# Patient Record
Sex: Male | Born: 1950 | Race: Black or African American | Hispanic: No | Marital: Single | Smoking: Never smoker
Health system: Southern US, Community
[De-identification: ages and names within clinical notes are randomized; demographics above are authoritative.]

## PROBLEM LIST (undated history)

## (undated) DIAGNOSIS — N400 Enlarged prostate without lower urinary tract symptoms: Secondary | ICD-10-CM

## (undated) DIAGNOSIS — I10 Essential (primary) hypertension: Secondary | ICD-10-CM

## (undated) DIAGNOSIS — R42 Dizziness and giddiness: Secondary | ICD-10-CM

## (undated) HISTORY — PX: REPAIR KNEE LIGAMENT: SUR1188

## (undated) HISTORY — DX: Benign prostatic hyperplasia without lower urinary tract symptoms: N40.0

## (undated) HISTORY — DX: Dizziness and giddiness: R42

## (undated) HISTORY — DX: Essential (primary) hypertension: I10

---

## 1993-04-29 HISTORY — PX: NEPHRECTOMY: SHX65

## 2012-08-09 ENCOUNTER — Ambulatory Visit (INDEPENDENT_AMBULATORY_CARE_PROVIDER_SITE_OTHER): Admitting: Emergency Medicine

## 2012-08-09 VITALS — BP 128/86 | HR 76 | Temp 97.6°F | Resp 16 | Ht 69.0 in | Wt 158.0 lb

## 2012-08-09 DIAGNOSIS — H8110 Benign paroxysmal vertigo, unspecified ear: Secondary | ICD-10-CM

## 2012-08-09 DIAGNOSIS — R42 Dizziness and giddiness: Secondary | ICD-10-CM

## 2012-08-09 DIAGNOSIS — R112 Nausea with vomiting, unspecified: Secondary | ICD-10-CM

## 2012-08-09 DIAGNOSIS — D649 Anemia, unspecified: Secondary | ICD-10-CM

## 2012-08-09 LAB — POCT CBC
Granulocyte percent: 77 %G (ref 37–80)
HCT, POC: 40.5 % — AB (ref 43.5–53.7)
Lymph, poc: 1.2 (ref 0.6–3.4)
MCHC: 30.4 g/dL — AB (ref 31.8–35.4)
MCV: 84 fL (ref 80–97)
MID (cbc): 0.3 (ref 0–0.9)
POC LYMPH PERCENT: 17.9 %L (ref 10–50)
Platelet Count, POC: 220 10*3/uL (ref 142–424)
RDW, POC: 14 %

## 2012-08-09 MED ORDER — ONDANSETRON 4 MG PO TBDP: ORAL_TABLET | ORAL | Status: DC

## 2012-08-09 MED ORDER — ONDANSETRON 4 MG PO TBDP
8.0000 mg | ORAL_TABLET | Freq: Once | ORAL | Status: AC
  Administered 2012-08-09: 8 mg via ORAL

## 2012-08-09 MED ORDER — DIAZEPAM 5 MG PO TABS: ORAL_TABLET | ORAL | Status: DC

## 2012-08-09 NOTE — Progress Notes (Signed)
  Subjective:    Patient ID: Darrell Wells, male    DOB: August 22, 1950, 62 y.o.   MRN: [id number]  HPI Pt woke up this morning and was getting dressed for church when he started to fell like the room was spinning. Thought it was his BP. He tried to eat a little, twice, and he vomited both times. Feels better sitting up vs lying down. Pt sensitive to positional movements.  Not currently feeling dizzy or nauseous, but did vomit while waiting in our lobby. Had this a few years ago and was diagnosed with vertigo and htn. He was put on BP med at that time. Pt very healthy otherwise. Exercises. Just retired from [employer].  Has some tinnitus, but has had hearing checked and it was normal.   Review of Systems     Objective:   Physical Exam pupils equal round regular react to light direct and consensual. Cranial nerves II through XII are intact the no diplopia noted on confrontation. No nystagmus was noted. Patient did state he felt nauseated with dizziness when he looked in different directions. His chest was clear heart regular rate without murmurs the abdomen is soft there are no areas of tenderness. Deep tendon reflexes of the knees and ankles and brachial radialis are 2+  Results for orders placed in visit on 08/09/12  POCT CBC      Result Value Range   WBC 6.6  4.6 - 10.2 K/uL   Lymph, poc 1.2  0.6 - 3.4   POC LYMPH PERCENT 17.9  10 - 50 %L   MID (cbc) 0.3  0 - 0.9   POC MID % 5.1  0 - 12 %M   POC Granulocyte 5.1  2 - 6.9   Granulocyte percent 77.0  37 - 80 %G   RBC 4.82  4.69 - 6.13 M/uL   Hemoglobin 12.3 (*) 14.1 - 18.1 g/dL   HCT, POC 95.6 (*) 21.3 - 53.7 %   MCV 84.0  80 - 97 fL   MCH, POC 25.5 (*) 27 - 31.2 pg   MCHC 30.4 (*) 31.8 - 35.4 g/dL   RDW, POC 08.6     Platelet Count, POC 220  142 - 424 K/uL   MPV 9.6  0 - 99.8 fL  GLUCOSE, POCT (MANUAL RESULT ENTRY)      Result Value Range   POC Glucose 121 (*) 70 - 99 mg/dl        Assessment & Plan:  We'll go ahead and check  glucose and CBC. He was given Zofran 8 mg ODT . We'll treat with Valium low dose. He's given informational materials on vertigo.

## 2012-08-09 NOTE — Patient Instructions (Addendum)
Vertigo Vertigo means you feel like you or your surroundings are moving when they are not. Vertigo can be dangerous if it occurs when you are at work, driving, or performing difficult activities.  CAUSES  Vertigo occurs when there is a conflict of signals sent to your brain from the visual and sensory systems in your body. There are many different causes of vertigo, including:  Infections, especially in the inner ear.  A bad reaction to a drug or misuse of alcohol and medicines.  Withdrawal from drugs or alcohol.  Rapidly changing positions, such as lying down or rolling over in bed.  A migraine headache.  Decreased blood flow to the brain.  Increased pressure in the brain from a head injury, infection, tumor, or bleeding. SYMPTOMS  You may feel as though the world is spinning around or you are falling to the ground. Because your balance is upset, vertigo can cause nausea and vomiting. You may have involuntary eye movements (nystagmus). DIAGNOSIS  Vertigo is usually diagnosed by physical exam. If the cause of your vertigo is unknown, your caregiver may perform imaging tests, such as an MRI scan (magnetic resonance imaging). TREATMENT  Most cases of vertigo resolve on their own, without treatment. Depending on the cause, your caregiver may prescribe certain medicines. If your vertigo is related to body position issues, your caregiver may recommend movements or procedures to correct the problem. In rare cases, if your vertigo is caused by certain inner ear problems, you may need surgery. HOME CARE INSTRUCTIONS   Follow your caregiver's instructions.  Avoid driving.  Avoid operating heavy machinery.  Avoid performing any tasks that would be dangerous to you or others during a vertigo episode.  Tell your caregiver if you notice that certain medicines seem to be causing your vertigo. Some of the medicines used to treat vertigo episodes can actually make them worse in some people. SEEK  IMMEDIATE MEDICAL CARE IF:   Your medicines do not relieve your vertigo or are making it worse.  You develop problems with talking, walking, weakness, or using your arms, hands, or legs.  You develop severe headaches.  Your nausea or vomiting continues or gets worse.  You develop visual changes.  A family member notices behavioral changes.  Your condition gets worse. MAKE SURE YOU:  Understand these instructions.  Will watch your condition.  Will get help right away if you are not doing well or get worse. Document Released: 01/23/2005 Document Revised: 07/08/2011 Document Reviewed: 11/01/2010 ExitCare Patient Information 2013 [employer].  

## 2012-08-10 LAB — IRON AND TIBC: %SAT: 26 % (ref 20–55)

## 2012-08-11 NOTE — Progress Notes (Signed)
Left msg for pt to schedule future CPE with Dr. Daub. 

## 2012-08-12 NOTE — Progress Notes (Signed)
Pt scheduled CPE with Dr. Cleta Alberts for 12/29/12.

## 2012-12-29 ENCOUNTER — Ambulatory Visit (INDEPENDENT_AMBULATORY_CARE_PROVIDER_SITE_OTHER): Admitting: Emergency Medicine

## 2012-12-29 ENCOUNTER — Encounter: Payer: Self-pay | Admitting: Emergency Medicine

## 2012-12-29 ENCOUNTER — Telehealth: Payer: Self-pay | Admitting: *Deleted

## 2012-12-29 VITALS — BP 138/94 | HR 69 | Temp 98.0°F | Resp 16 | Ht 69.0 in | Wt 156.0 lb

## 2012-12-29 DIAGNOSIS — N529 Male erectile dysfunction, unspecified: Secondary | ICD-10-CM

## 2012-12-29 DIAGNOSIS — L989 Disorder of the skin and subcutaneous tissue, unspecified: Secondary | ICD-10-CM

## 2012-12-29 DIAGNOSIS — I10 Essential (primary) hypertension: Secondary | ICD-10-CM

## 2012-12-29 DIAGNOSIS — N400 Enlarged prostate without lower urinary tract symptoms: Secondary | ICD-10-CM

## 2012-12-29 DIAGNOSIS — Z0000 Encounter for general adult medical examination without abnormal findings: Secondary | ICD-10-CM

## 2012-12-29 DIAGNOSIS — Z1211 Encounter for screening for malignant neoplasm of colon: Secondary | ICD-10-CM

## 2012-12-29 LAB — POCT URINALYSIS DIPSTICK
Bilirubin, UA: NEGATIVE
Glucose, UA: NEGATIVE
Leukocytes, UA: NEGATIVE
Nitrite, UA: NEGATIVE
Urobilinogen, UA: 0.2

## 2012-12-29 LAB — POCT UA - MICROSCOPIC ONLY
Bacteria, U Microscopic: NEGATIVE
Casts, Ur, LPF, POC: NEGATIVE
Crystals, Ur, HPF, POC: NEGATIVE
Mucus, UA: NEGATIVE
Yeast, UA: NEGATIVE

## 2012-12-29 LAB — COMPREHENSIVE METABOLIC PANEL
ALT: 8 U/L (ref 0–53)
AST: 15 U/L (ref 0–37)
Albumin: 3.9 g/dL (ref 3.5–5.2)
Alkaline Phosphatase: 67 U/L (ref 39–117)
BUN: 15 mg/dL (ref 6–23)
Chloride: 105 mEq/L (ref 96–112)
Potassium: 3.8 mEq/L (ref 3.5–5.3)
Sodium: 138 mEq/L (ref 135–145)

## 2012-12-29 LAB — PSA: PSA: 0.48 ng/mL (ref <=4.00)

## 2012-12-29 LAB — LIPID PANEL
HDL: 60 mg/dL (ref >39)
LDL Cholesterol: 85 mg/dL (ref 0–99)
Total CHOL/HDL Ratio: 2.7 Ratio
Triglycerides: 80 mg/dL (ref <150)

## 2012-12-29 LAB — IFOBT (OCCULT BLOOD): IFOBT: NEGATIVE

## 2012-12-29 MED ORDER — FINASTERIDE 5 MG PO TABS: 5.0000 mg | ORAL_TABLET | Freq: Every day | ORAL | Status: DC

## 2012-12-29 MED ORDER — LISINOPRIL 20 MG PO TABS: 20.0000 mg | ORAL_TABLET | Freq: Every day | ORAL | Status: DC

## 2012-12-29 MED ORDER — SILDENAFIL CITRATE 100 MG PO TABS: 50.0000 mg | ORAL_TABLET | Freq: Every day | ORAL | Status: DC | PRN

## 2012-12-29 MED ORDER — VARDENAFIL HCL 20 MG PO TABS: ORAL_TABLET | ORAL | Status: DC

## 2012-12-29 NOTE — Telephone Encounter (Signed)
The patient's pharmacy called about the Levitra, the insurance needs an authorization for medication. Dr Cleta Alberts was advised and he asked  me to call patient to see if he wanted to try Cialis or Viagra. i called and the patient will try Viagra. Also, I called [employer] ([employer] sciences)) and the  the insurance will pay for medicine.

## 2012-12-29 NOTE — Addendum Note (Signed)
Addended by: Lesle Chris A on: 12/29/2012 03:15 PM   Modules accepted: Orders, Medications

## 2012-12-29 NOTE — Progress Notes (Signed)
@[email or web address] @  Patient ID: Darrell Wells MRN: [id number], DOB: [birth year] 62 y.o. Date of Encounter: 12/29/2012, 8:51 AM  Primary Physician: No PCP Per Patient  Chief Complaint: Physical (CPE)  HPI: 62 y.o. y/o male with history noted below here for CPE.  Doing well. No issues/complaints.  Review of Systems:  Consitutional: No fever, chills, fatigue, night sweats, lymphadenopathy, or weight changes. Eyes: No visual changes, eye redness, or discharge. ENT/Mouth: Ears: No otalgia, tinnitus, hearing loss, discharge. Nose: No congestion, rhinorrhea, sinus pain, or epistaxis. Throat: No sore throat, post nasal drip, or teeth pain. Cardiovascular: No CP, palpitations, diaphoresis, DOE, edema, orthopnea, PND. Respiratory: No cough, hemoptysis, SOB, or wheezing. Gastrointestinal: No anorexia, dysphagia, reflux, pain, nausea, vomiting, hematemesis, diarrhea, constipation, BRBPR, or melena. Genitourinary: No dysuria, frequency, urgency, hematuria, incontinence, nocturia, decreased urinary stream, discharge, impotence, or testicular pain/masses. Musculoskeletal: He recently tore his left quadriceps tendon had surgical repair through the [employer] services .  Skin: No rash, erythema, lesion changes, pain, warmth, jaundice, or pruritis. Neurological: No headache, dizziness, syncope, seizures, tremors, memory loss, coordination problems, or paresthesias. Psychological: No anxiety, depression, hallucinations, SI/HI. Endocrine: No fatigue, polydipsia, polyphagia, polyuria, or known diabetes. All other systems were reviewed and are otherwise negative.  No past medical history on file.   No past surgical history on file.  Home Meds:  Prior to Admission medications   Medication Sig Start Date End Date Taking? Authorizing Provider  finasteride (PROSCAR) 5 MG tablet Take 5 mg by mouth daily.   Yes Historical Provider, MD  lisinopril (PRINIVIL,ZESTRIL) 20 MG tablet Take 20 mg by mouth daily.   Yes Historical  Provider, MD  diazepam (VALIUM) 5 MG tablet Take one half to one tablet every 6-8 hours as needed for dizziness 08/09/12   Collene Gobble, MD  ondansetron (ZOFRAN ODT) 4 MG disintegrating tablet Take 1-2 tablets to dissolve in your mouth every 6-8 hours as needed for 08/09/12   Collene Gobble, MD    Allergies: No Known Allergies  History   Social History  . Marital Status: Single    Spouse Name: N/A    Number of Children: N/A  . Years of Education: N/A   Occupational History  . Not on file.   Social History Main Topics  . Smoking status: Never Smoker   . Smokeless tobacco: Not on file  . Alcohol Use: Yes  . Drug Use: No  . Sexual Activity: Not on file   Other Topics Concern  . Not on file   Social History Narrative  . No narrative on file    No family history on file.  Physical Exam:  Blood pressure 138/94, pulse 69, temperature 98 F (36.7 C), temperature source Oral, resp. rate 16, height 5\' 9"  (1.753 m), weight 156 lb (70.761 kg), SpO2 100.00%.  General: Well developed, well nourished, in no acute distress. HEENT: Normocephalic, atraumatic. Conjunctiva pink, sclera non-icteric. Pupils 2 mm constricting to 1 mm, round, regular, and equally reactive to light and accomodation. EOMI. Internal auditory canal clear. TMs with good cone of light and without pathology. Nasal mucosa pink. Nares are without discharge. No sinus tenderness. Oral mucosa pink. Dentition . Pharynx without exudate.   Neck: Supple. Trachea midline. No thyromegaly. Full ROM. No lymphadenopathy. Lungs: Clear to auscultation bilaterally without wheezes, rales, or rhonchi. Breathing is of normal effort and unlabored. Cardiovascular: RRR with S1 S2. No murmurs, rubs, or gallops appreciated. Distal pulses 2+ symmetrically. No carotid or abdominal bruits.  Abdomen: Soft, non-tender, non-distended with normoactive  bowel sounds. No hepatosplenomegaly or masses. No rebound/guarding. No CVA tenderness. Without  hernias.  Rectal: No external hemorrhoids or fissures. Rectal vault without masses.   Genitourinary:   circumcised male. No penile lesions. Testes descended bilaterally, and smooth without tenderness or masses.  Musculoskeletal: Full range of motion and 5/5 strength throughout. Without swelling, atrophy, tenderness, crepitus, or warmth. Extremities without clubbing, cyanosis, or edema. Calves supple. Patient has a long leg brace over the left knee. Skin: Warm and moist without erythema, ecchymosis, wounds, or rash. It is a half by half centimeter irregular pigmented lesion over the buttocks Neuro: A+Ox3. CN II-XII grossly intact. Moves all extremities spontaneously. Full sensation throughout. Normal gait. DTR 2+ throughout upper and lower extremities. Finger to nose intact. Psych:  Responds to questions appropriately with a normal affect.   Results for orders placed in visit on 12/29/12  IFOBT (OCCULT BLOOD)      Result Value Range   IFOBT Negative    POCT URINALYSIS DIPSTICK      Result Value Range   Color, UA yellow     Clarity, UA clear     Glucose, UA neg     Bilirubin, UA neg     Ketones, UA neg     Spec Grav, UA >=1.030     Blood, UA trace     pH, UA 6.0     Protein, UA neg     Urobilinogen, UA 0.2     Nitrite, UA neg     Leukocytes, UA Negative    POCT UA - MICROSCOPIC ONLY      Result Value Range   WBC, Ur, HPF, POC neg     RBC, urine, microscopic 0-4     Bacteria, U Microscopic neg     Mucus, UA neg     Epithelial cells, urine per micros 0-1     Crystals, Ur, HPF, POC neg     Casts, Ur, LPF, POC neg     Yeast, UA neg     Studies:      Assessment/Plan:  62 y.o. y/o male here for followup physical exam. He did have a pigmented mole on his buttocks and needs to be seen by the dermatologist and have set him up for screening colonoscopy. Routine labs were done  -  Signed, Earl Lites, MD 12/29/2012 8:51 AM

## 2013-01-05 ENCOUNTER — Encounter: Payer: Self-pay | Admitting: Gastroenterology

## 2013-03-08 ENCOUNTER — Ambulatory Visit (AMBULATORY_SURGERY_CENTER): Payer: Self-pay | Admitting: *Deleted

## 2013-03-08 VITALS — Ht 69.0 in | Wt 163.0 lb

## 2013-03-08 DIAGNOSIS — Z1211 Encounter for screening for malignant neoplasm of colon: Secondary | ICD-10-CM

## 2013-03-08 MED ORDER — NA SULFATE-K SULFATE-MG SULF 17.5-3.13-1.6 GM/177ML PO SOLN: 1.0000 | Freq: Once | ORAL | Status: DC

## 2013-03-08 NOTE — Progress Notes (Signed)
No allergies to eggs or soy. No problems with anesthesia.  

## 2013-03-22 ENCOUNTER — Ambulatory Visit (AMBULATORY_SURGERY_CENTER): Payer: TRICARE For Life (TFL) | Admitting: Gastroenterology

## 2013-03-22 ENCOUNTER — Encounter: Payer: Self-pay | Admitting: Gastroenterology

## 2013-03-22 VITALS — BP 109/78 | HR 71 | Temp 96.3°F | Resp 16 | Ht 69.0 in | Wt 163.0 lb

## 2013-03-22 DIAGNOSIS — Z1211 Encounter for screening for malignant neoplasm of colon: Secondary | ICD-10-CM

## 2013-03-22 DIAGNOSIS — D126 Benign neoplasm of colon, unspecified: Secondary | ICD-10-CM

## 2013-03-22 MED ORDER — SODIUM CHLORIDE 0.9 % IV SOLN: 500.0000 mL | INTRAVENOUS | Status: DC

## 2013-03-22 NOTE — Op Note (Signed)
 [facility] [address] [place], [address]   COLONOSCOPY PROCEDURE REPORT  PATIENT: Darrell Wells, Darrell Wells  MR#: [id number] BIRTHDATE: January 08, 1951 , 62  yrs. old GENDER: Male ENDOSCOPIST: Louis Meckel, MD REFERRED XB:JYNWGN Cleta Alberts, M.D. PROCEDURE DATE:  03/22/2013 PROCEDURE:   Colonoscopy with cold biopsy polypectomy First Screening Colonoscopy - Avg.  risk and is 50 yrs.  old or older Yes.  Prior Negative Screening - Now for repeat screening. N/A  History of Adenoma - Now for follow-up colonoscopy & has been > or = to 3 yrs.  N/A  Polyps Removed Today? Yes. ASA CLASS:   Class II INDICATIONS:average risk screening. MEDICATIONS: MAC sedation, administered by CRNA and propofol (Diprivan) 200mg  IV  DESCRIPTION OF PROCEDURE:   After the risks benefits and alternatives of the procedure were thoroughly explained, informed consent was obtained.  A digital rectal exam revealed no abnormalities of the rectum.   The LB [id number]  endoscope was introduced through the anus and advanced to the cecum, which was identified by both the appendix and ileocecal valve. No adverse events experienced.   The quality of the prep was excellent using Suprep  The instrument was then slowly withdrawn as the colon was fully examined.      COLON FINDINGS: A sessile polyp was found at the cecum.  A polypectomy was performed with cold forceps.   Internal hemorrhoids were found.   The colon was otherwise normal.  There was no diverticulosis, inflammation, polyps or cancers unless previously stated.  Retroflexed views revealed no abnormalities. The time to cecum=3 minutes 16 seconds.  Withdrawal time=7 minutes 15 seconds. The scope was withdrawn and the procedure completed. COMPLICATIONS: There were no complications.  ENDOSCOPIC IMPRESSION: 1.   Sessile polyp was found at the cecum; polypectomy was performed with cold forceps 2.   Internal hemorrhoids 3.   The colon was otherwise  normal  RECOMMENDATIONS: If the polyp(s) removed today are proven to be adenomatous (pre-cancerous) polyps, you will need a repeat colonoscopy in 5 years.  Otherwise you should continue to follow colorectal cancer screening guidelines for "routine risk" patients with colonoscopy in 10 years.  You will receive a letter within 1-2 weeks with the results of your biopsy as well as final recommendations.  Please call my office if you have not received a letter after 3 weeks.   eSigned:  Louis Meckel, MD 03/22/2013 12:02 PM   cc:   PATIENT NAME:  Darrell Wells, Darrell Wells MR#: [id number]

## 2013-03-22 NOTE — Progress Notes (Signed)
Patient did not experience any of the following events: a burn prior to discharge; a fall within the facility; wrong site/side/patient/procedure/implant event; or a hospital transfer or hospital admission upon discharge from the facility. (G8907) Patient did not have preoperative order for IV antibiotic SSI prophylaxis. (G8918)  

## 2013-03-22 NOTE — Patient Instructions (Signed)
YOU HAD AN ENDOSCOPIC PROCEDURE TODAY AT THE [facility]: Refer to the procedure report that was given to you for any specific questions about what was found during the examination.  If the procedure report does not answer your questions, please call your gastroenterologist to clarify.  If you requested that your care partner not be given the details of your procedure findings, then the procedure report has been included in a sealed envelope for you to review at your convenience later.  YOU SHOULD EXPECT: Some feelings of bloating in the abdomen. Passage of more gas than usual.  Walking can help get rid of the air that was put into your GI tract during the procedure and reduce the bloating. If you had a lower endoscopy (such as a colonoscopy or flexible sigmoidoscopy) you may notice spotting of blood in your stool or on the toilet paper. If you underwent a bowel prep for your procedure, then you may not have a normal bowel movement for a few days.  DIET: Your first meal following the procedure should be a light meal and then it is ok to progress to your normal diet.  A half-sandwich or bowl of soup is an example of a good first meal.  Heavy or fried foods are harder to digest and may make you feel nauseous or bloated.  Likewise meals heavy in dairy and vegetables can cause extra gas to form and this can also increase the bloating.  Drink plenty of fluids but you should avoid alcoholic beverages for 24 hours.  ACTIVITY: Your care partner should take you home directly after the procedure.  You should plan to take it easy, moving slowly for the rest of the day.  You can resume normal activity the day after the procedure however you should NOT DRIVE or use heavy machinery for 24 hours (because of the sedation medicines used during the test).    SYMPTOMS TO REPORT IMMEDIATELY: A gastroenterologist can be reached at any hour.  During normal business hours, 8:30 AM to 5:00 PM Monday through Friday,  call [phone].  After hours and on weekends, please call the GI answering service at [phone] who will take a message and have the physician on call contact you.   Following lower endoscopy (colonoscopy or flexible sigmoidoscopy):  Excessive amounts of blood in the stool  Significant tenderness or worsening of abdominal pains  Swelling of the abdomen that is new, acute  Fever of 100F or higher    FOLLOW UP: If any biopsies were taken you will be contacted by phone or by letter within the next 1-3 weeks.  Call your gastroenterologist if you have not heard about the biopsies in 3 weeks.  Our staff will call the home number listed on your records the next business day following your procedure to check on you and address any questions or concerns that you may have at that time regarding the information given to you following your procedure. This is a courtesy call and so if there is no answer at the home number and we have not heard from you through the emergency physician on call, we will assume that you have returned to your regular daily activities without incident.  SIGNATURES/CONFIDENTIALITY: You and/or your care partner have signed paperwork which will be entered into your electronic medical record.  These signatures attest to the fact that that the information above on your After Visit Summary has been reviewed and is understood.  Full responsibility of the confidentiality   of this discharge information lies with you and/or your care-partner.    Information on polyps,hemorrhoids,& hemorrhoid banding given to you today as requested by Dr. Arlyce Dice.

## 2013-03-22 NOTE — Progress Notes (Signed)
Called to room to assist during endoscopic procedure.  Patient ID and intended procedure confirmed with present staff. Received instructions for my participation in the procedure from the performing physician.  

## 2013-03-22 NOTE — Progress Notes (Signed)
  [facility] Anesthesia Post-op Note  Patient: Darrell Wells  Procedure(s) Performed: colonoscopy  Patient Location: [facility] - Recovery Area  Anesthesia Type: Deep Sedation/Propofol  Level of Consciousness: awake, oriented and patient cooperative  Airway and Oxygen Therapy: Patient Spontanous Breathing  Post-op Pain: none  Post-op Assessment:  Post-op Vital signs reviewed, Patient's Cardiovascular Status Stable, Respiratory Function Stable, Patent Airway, No signs of Nausea or vomiting and Pain level controlled  Post-op Vital Signs: Reviewed and stable  Complications: No apparent anesthesia complications  Travares Nelles E 12:04 PM

## 2013-03-23 ENCOUNTER — Telehealth: Payer: Self-pay

## 2013-03-23 NOTE — Telephone Encounter (Signed)
  Follow up Call-  Call back number 03/22/2013  Post procedure Call Back phone  # [phone]  Permission to leave phone message Yes     Patient questions:  Do you have a fever, pain , or abdominal swelling? no Pain Score  0 *  Have you tolerated food without any problems? yes  Have you been able to return to your normal activities? yes  Do you have any questions about your discharge instructions: Diet   no Medications  no Follow up visit  no  Do you have questions or concerns about your Care? no  Actions: * If pain score is 4 or above: No action needed, pain <4.  Per the pt everything went very well.  He said he felt great this am. Maw

## 2013-03-29 ENCOUNTER — Encounter: Payer: Self-pay | Admitting: Gastroenterology

## 2013-12-22 ENCOUNTER — Encounter: Payer: Self-pay | Admitting: Gastroenterology

## 2014-01-11 ENCOUNTER — Encounter: Payer: Self-pay | Admitting: Gastroenterology

## 2014-01-27 ENCOUNTER — Other Ambulatory Visit: Payer: Self-pay | Admitting: Emergency Medicine

## 2014-01-31 NOTE — Telephone Encounter (Signed)
Dr Everlene Farrier, pt is due for CPE. I pended 1 RF w/note needs OV for more for your review.

## 2014-02-25 ENCOUNTER — Telehealth: Payer: Self-pay | Admitting: *Deleted

## 2014-02-25 NOTE — Telephone Encounter (Signed)
T/C to patient to cancel colonoscopy. Last colonoscopy 02/2013, recall 5 years. Error with recall letter being sent for 1 year. Per Dr. Deatra Ina, cancel colonoscopy scheduled for 03/15/14. No answer. Number identifier. Message left that previsit and colonoscopy will be cancelled. Recall will be entered for 02/2018. If patient is having any concerns, then he will need an office visit.

## 2014-03-15 ENCOUNTER — Encounter: Payer: TRICARE For Life (TFL) | Admitting: Gastroenterology

## 2014-05-03 ENCOUNTER — Ambulatory Visit (INDEPENDENT_AMBULATORY_CARE_PROVIDER_SITE_OTHER): Admitting: Emergency Medicine

## 2014-05-03 ENCOUNTER — Encounter: Payer: Self-pay | Admitting: Emergency Medicine

## 2014-05-03 VITALS — BP 148/86 | HR 61 | Temp 97.5°F | Resp 14 | Ht 69.25 in | Wt 161.4 lb

## 2014-05-03 DIAGNOSIS — L989 Disorder of the skin and subcutaneous tissue, unspecified: Secondary | ICD-10-CM

## 2014-05-03 DIAGNOSIS — Z0000 Encounter for general adult medical examination without abnormal findings: Secondary | ICD-10-CM

## 2014-05-03 DIAGNOSIS — N400 Enlarged prostate without lower urinary tract symptoms: Secondary | ICD-10-CM

## 2014-05-03 DIAGNOSIS — I10 Essential (primary) hypertension: Secondary | ICD-10-CM | POA: Insufficient documentation

## 2014-05-03 DIAGNOSIS — N529 Male erectile dysfunction, unspecified: Secondary | ICD-10-CM

## 2014-05-03 DIAGNOSIS — Z23 Encounter for immunization: Secondary | ICD-10-CM

## 2014-05-03 DIAGNOSIS — N528 Other male erectile dysfunction: Secondary | ICD-10-CM

## 2014-05-03 LAB — COMPREHENSIVE METABOLIC PANEL
ALBUMIN: 4.1 g/dL (ref 3.5–5.2)
ALT: 14 U/L (ref 0–53)
AST: 21 U/L (ref 0–37)
Alkaline Phosphatase: 57 U/L (ref 39–117)
BUN: 15 mg/dL (ref 6–23)
CO2: 28 mEq/L (ref 19–32)
Calcium: 9.7 mg/dL (ref 8.4–10.5)
Chloride: 103 mEq/L (ref 96–112)
Creat: 1.35 mg/dL (ref 0.50–1.35)
Glucose, Bld: 72 mg/dL (ref 70–99)
Potassium: 4.1 mEq/L (ref 3.5–5.3)
Sodium: 140 mEq/L (ref 135–145)
Total Bilirubin: 0.5 mg/dL (ref 0.2–1.2)
Total Protein: 7.4 g/dL (ref 6.0–8.3)

## 2014-05-03 LAB — LIPID PANEL
Cholesterol: 183 mg/dL (ref 0–200)
HDL: 63 mg/dL (ref >39)
LDL CALC: 108 mg/dL — AB (ref 0–99)
TRIGLYCERIDES: 59 mg/dL (ref <150)
Total CHOL/HDL Ratio: 2.9 Ratio
VLDL: 12 mg/dL (ref 0–40)

## 2014-05-03 LAB — POCT URINALYSIS DIPSTICK
BILIRUBIN UA: NEGATIVE
Blood, UA: NEGATIVE
Glucose, UA: NEGATIVE
KETONES UA: NEGATIVE
LEUKOCYTES UA: NEGATIVE
Nitrite, UA: NEGATIVE
PH UA: 7.5
Protein, UA: 30
SPEC GRAV UA: 1.015
Urobilinogen, UA: 0.2

## 2014-05-03 LAB — TSH: TSH: 0.68 u[IU]/mL (ref 0.350–4.500)

## 2014-05-03 MED ORDER — AMLODIPINE BESYLATE 5 MG PO TABS: 5.0000 mg | ORAL_TABLET | Freq: Every day | ORAL | Status: DC

## 2014-05-03 MED ORDER — SILDENAFIL CITRATE 100 MG PO TABS: ORAL_TABLET | ORAL | Status: DC

## 2014-05-03 MED ORDER — LISINOPRIL 20 MG PO TABS: 30.0000 mg | ORAL_TABLET | Freq: Every day | ORAL | Status: DC

## 2014-05-03 MED ORDER — FINASTERIDE 5 MG PO TABS: 5.0000 mg | ORAL_TABLET | Freq: Every day | ORAL | Status: DC

## 2014-05-03 NOTE — Progress Notes (Signed)
Subjective:  This chart was scribed for Darrell Jordan, MD by Dellis Filbert, ED Scribe at [facility].The patient was seen in exam room 22 and the patient's care was started at 3:05 PM.   Patient ID: Darrell Wells, male    DOB: April 15, 1951, 64 y.o.   MRN: [id number] Chief Complaint  Patient presents with   Annual Exam   Flu Vaccine   HPI  HPI Comments: Darrell Wells is a 64 y.o. male with a history of HTN and an enlarge prostates who presents to [facility] for an annual physical exam and flu vaccine. Pt takes alka seltzer cold and flu for rhinorrhea. Still has urinary frequency. Has vertigo which has been more frequent. BP was up today at 148/86. Checks his BP at home and is usually 118/89. Has had his colonoscopy November 2014, with no abnormal finding.  Checked his PSA in 2014, level was .48. Does drink but does not smoke or use illicit drugs. Pt states he exercises 4 times a week. He is retired from [name] and does not work. 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  There are no active problems to display for this patient.  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 Authorizing Provider  diazepam (VALIUM) 5 MG tablet Take one half to one tablet every 6-8 hours as needed for dizziness 08/09/12  Yes [name], MD  finasteride (PROSCAR) 5 MG tablet Take 1 tablet (5 mg total) by mouth daily. 12/29/12  Yes [name], MD  lisinopril (PRINIVIL,ZESTRIL) 20 MG tablet Take 1 tablet (20 mg total) by mouth daily. 12/29/12  Yes [name], MD  ondansetron (ZOFRAN ODT) 4 MG disintegrating tablet Take 1-2 tablets to  dissolve in your mouth every 6-8 hours as needed for 08/09/12  Yes [name], MD  sildenafil (VIAGRA) 100 MG tablet Take 1/2 to 1 tab by mouth daily as needed for erectile dysfunction. PATIENT NEEDS OFFICE VISIT FOR ADDITIONAL REFILLS 01/31/14  Yes [name], MD   History   Social History   Marital Status: Single    Spouse Name: N/A    Number of Children: N/A   Years of Education: N/A   Occupational History   Not on file.   Social History Main Topics   Smoking status: Never Smoker    Smokeless tobacco: Never Used   Alcohol Use: Yes     Comment: rare   Drug Use: No   Sexual Activity: Yes   Other Topics Concern   Not on file   Social History Narrative   Review of Systems  HENT: Positive for rhinorrhea.   Genitourinary: Positive for frequency.  Neurological: Positive for dizziness.       Objective:  BP 148/86 mmHg   Pulse 61   Temp(Src) 97.5 F (36.4 C) (Oral)   Resp 14   Ht 5' 9.25" (1.759 m)   Wt 161 lb 6.4 oz (73.211 kg)   BMI 23.66 kg/m2   SpO2 99%  Physical Exam  Constitutional: He is oriented to person, place, and time. He appears well-developed  and well-nourished.  HENT:  Head: Normocephalic and atraumatic.  Eyes: EOM are normal.  Neck: Normal range of motion.  Cardiovascular: Normal rate.   Pulmonary/Chest: Effort normal.  Musculoskeletal: Normal range of motion.  Neurological: He is alert and oriented to person, place, and time.  Skin: Skin is warm and dry.  Psychiatric: He has a normal mood and affect. His behavior is normal.  Nursing note and vitals reviewed.     Assessment & Plan:  His blood pressure is not at goal. I added amlodipine 5 mg 1 a day. He recently lost his brother but is coping with that well. Otherwise he is doing well his Proscar and Viagra were refilled and routine blood work was done today.I personally performed the services described in this documentation, which was scribed in my presence. The recorded information has been  reviewed and is accurate.

## 2014-05-03 NOTE — Progress Notes (Addendum)
Subjective:  This chart was scribed for Darrell Jordan, MD by Dellis Filbert, ED Scribe at [facility].The patient was seen in exam room 22 and the patient's care was started at 3:05 PM.   Patient ID: Darrell Wells, male    DOB: 1950/09/02, 64 y.o.   MRN: [id number] Chief Complaint  Patient presents with  . Annual Exam  . Flu Vaccine   HPI  HPI Comments: Darrell Wells is a 64 y.o. male with a history of HTN and an enlarge prostates who presents to [name] for an annual physical exam and flu vaccine. Pt takes alka seltzer cold and flu for rhinorrhea. Still has urinary frequency. Has vertigo which has been more frequent. BP was up today at 148/86. Checks his BP at home and is usually 118/89. Has had his colonoscopy November 2014, with no abnormal finding.  Checked his PSA in 2014, level was .48. Does drink but does not smoke or use illicit drugs. Pt states he exercises 4 times a week. He is retired from [name] and does not work. 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  Patient Active Problem List   Diagnosis Date Noted  . Essential hypertension, benign 05/03/2014  . BPH (benign prostatic hyperplasia) 05/03/2014  . Erectile dysfunction 05/03/2014   Past Medical History  Diagnosis Date  . Hypertension   . Enlarged prostate    Past Surgical History  Procedure Laterality Date  . 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 Authorizing Provider  diazepam (VALIUM) 5 MG tablet Take one half to one tablet every 6-8 hours as needed for dizziness 08/09/12  Yes [name], MD  finasteride (PROSCAR) 5 MG tablet Take 1 tablet (5 mg total) by mouth daily. 12/29/12  Yes [name], MD  lisinopril (PRINIVIL,ZESTRIL) 20 MG tablet Take 1 tablet (20 mg  total) by mouth daily. 12/29/12  Yes [name], MD  ondansetron (ZOFRAN ODT) 4 MG disintegrating tablet Take 1-2 tablets to dissolve in your mouth every 6-8 hours as needed for 08/09/12  Yes [name], MD  sildenafil (VIAGRA) 100 MG tablet Take 1/2 to 1 tab by mouth daily as needed for erectile dysfunction. PATIENT NEEDS OFFICE VISIT FOR ADDITIONAL REFILLS 01/31/14  Yes [name], MD   History   Social History  . Marital Status: Single    Spouse Name: N/A    Number of Children: N/A  . Years of Education: N/A   Occupational History  . Not on file.   Social History Main Topics  . Smoking status: Never Smoker   . Smokeless tobacco: Never Used  . Alcohol Use: Yes     Comment: rare  . Drug Use: No  . Sexual Activity: Yes   Other Topics Concern  . Not on file   Social History Narrative   Review of Systems  HENT: Positive for rhinorrhea.   Genitourinary: Positive for frequency.  Neurological: Positive for dizziness.       Objective:  BP 148/86 mmHg  Pulse 61  Temp(Src) 97.5 F (36.4 C) (Oral)  Resp 14  Ht 5' 9.25" (1.759 m)  Wt 161 lb 6.4 oz (73.211 kg)  BMI 23.66 kg/m2  SpO2 99%  Physical Exam  Constitutional:  He is oriented to person, place, and time. He appears well-developed and well-nourished.  HENT:  Head: Normocephalic and atraumatic.  Eyes: EOM are normal.  Neck: Normal range of motion.  Cardiovascular: Normal rate.   Pulmonary/Chest: Effort normal.  Musculoskeletal: Normal range of motion.  Neurological: He is alert and oriented to person, place, and time.  Skin: Skin is warm and dry.  Psychiatric: He has a normal mood and affect. His behavior is normal.  Nursing note and vitals reviewed. Skin there is a pigmented 1 x 0.5 cm area lower back with a 2-3 mm nodular area at the base of this lesion.    Assessment & Plan:  His blood pressure is not at goal. I added amlodipine 5 mg 1 a day. He recently lost his brother but is coping with that well.  Otherwise he is doing well his Proscar and Viagra were refilled and routine blood work was done today.I personally performed the services described in this documentation, which was scribed in my presence. The recorded information has been reviewed and is accurate. Referral made to dermatology to check the mole on his back.

## 2014-05-04 LAB — CBC
HCT: 41.3 % (ref 39.0–52.0)
HEMOGLOBIN: 13.7 g/dL (ref 13.0–17.0)
MCH: 27.5 pg (ref 26.0–34.0)
MCHC: 33.2 g/dL (ref 30.0–36.0)
MCV: 82.8 fL (ref 78.0–100.0)
MPV: 10.4 fL (ref 8.6–12.4)
Platelets: 223 10*3/uL (ref 150–400)
RBC: 4.99 MIL/uL (ref 4.22–5.81)
RDW: 13.5 % (ref 11.5–15.5)
WBC: 4.1 10*3/uL (ref 4.0–10.5)

## 2014-05-04 LAB — PSA: PSA: 0.5 ng/mL (ref <=4.00)

## 2014-06-06 ENCOUNTER — Other Ambulatory Visit: Payer: Self-pay

## 2014-06-06 DIAGNOSIS — I10 Essential (primary) hypertension: Secondary | ICD-10-CM

## 2014-06-06 DIAGNOSIS — N400 Enlarged prostate without lower urinary tract symptoms: Secondary | ICD-10-CM

## 2014-06-06 MED ORDER — SILDENAFIL CITRATE 100 MG PO TABS: ORAL_TABLET | ORAL | Status: DC

## 2014-06-06 MED ORDER — AMLODIPINE BESYLATE 5 MG PO TABS: 5.0000 mg | ORAL_TABLET | Freq: Every day | ORAL | Status: DC

## 2014-06-06 MED ORDER — FINASTERIDE 5 MG PO TABS: 5.0000 mg | ORAL_TABLET | Freq: Every day | ORAL | Status: DC

## 2014-06-06 MED ORDER — LISINOPRIL 20 MG PO TABS: 30.0000 mg | ORAL_TABLET | Freq: Every day | ORAL | Status: DC

## 2014-06-06 NOTE — Telephone Encounter (Signed)
[facility] sent req to send Rxs to them. Re-sent the Rxs Dr Everlene Farrier sent to local pharm in Jan.

## 2015-01-31 ENCOUNTER — Encounter: Payer: Self-pay | Admitting: Emergency Medicine

## 2015-04-12 ENCOUNTER — Other Ambulatory Visit: Payer: Self-pay | Admitting: Emergency Medicine

## 2015-04-27 ENCOUNTER — Telehealth: Payer: Self-pay

## 2015-04-27 DIAGNOSIS — N400 Enlarged prostate without lower urinary tract symptoms: Secondary | ICD-10-CM

## 2015-04-27 NOTE — Telephone Encounter (Signed)
He can have refills of all of these medications for 2 months. That gives him time to see me at 102 so I can do appropriate blood work

## 2015-04-27 NOTE — Telephone Encounter (Signed)
Patient is requesting refills on lisinopril (PRINIVIL,ZESTRIL) 20 MG tablet sildenafil (VIAGRA) 100 MG tablet finasteride (PROSCAR) 5 MG tablet   [phone] (H)   [facility] was [facility]ed [facility] for the above.  Patient checking on status.

## 2015-05-01 MED ORDER — SILDENAFIL CITRATE 100 MG PO TABS: ORAL_TABLET | ORAL | Status: DC

## 2015-05-01 MED ORDER — FINASTERIDE 5 MG PO TABS: 5.0000 mg | ORAL_TABLET | Freq: Every day | ORAL | Status: DC

## 2015-05-01 MED ORDER — LISINOPRIL 20 MG PO TABS: ORAL_TABLET | ORAL | Status: DC

## 2015-05-01 NOTE — Telephone Encounter (Signed)
Rxs are pended as authorized, but [facility] for pt to CB and advise which pharm he wants them to be sent to since they are 30 day RFs? Or sent to [facility], and if so what is fax #? I have not received any reqs yet from the [place].

## 2015-05-01 NOTE — Telephone Encounter (Signed)
Pt CB and left VM that he would like the RFs to be sent to [facility]. Done.

## 2015-05-09 ENCOUNTER — Encounter: Admitting: Emergency Medicine

## 2015-05-09 ENCOUNTER — Encounter: Payer: Self-pay | Admitting: Emergency Medicine

## 2015-05-09 ENCOUNTER — Ambulatory Visit (INDEPENDENT_AMBULATORY_CARE_PROVIDER_SITE_OTHER): Admitting: Emergency Medicine

## 2015-05-09 VITALS — BP 136/87 | HR 73 | Temp 97.9°F | Resp 17 | Ht 68.5 in | Wt 157.0 lb

## 2015-05-09 DIAGNOSIS — N400 Enlarged prostate without lower urinary tract symptoms: Secondary | ICD-10-CM

## 2015-05-09 DIAGNOSIS — I10 Essential (primary) hypertension: Secondary | ICD-10-CM

## 2015-05-09 DIAGNOSIS — Z23 Encounter for immunization: Secondary | ICD-10-CM

## 2015-05-09 DIAGNOSIS — Z1159 Encounter for screening for other viral diseases: Secondary | ICD-10-CM

## 2015-05-09 DIAGNOSIS — Z0000 Encounter for general adult medical examination without abnormal findings: Secondary | ICD-10-CM | POA: Diagnosis not present

## 2015-05-09 DIAGNOSIS — N529 Male erectile dysfunction, unspecified: Secondary | ICD-10-CM

## 2015-05-09 DIAGNOSIS — Z125 Encounter for screening for malignant neoplasm of prostate: Secondary | ICD-10-CM | POA: Diagnosis not present

## 2015-05-09 DIAGNOSIS — Z13220 Encounter for screening for lipoid disorders: Secondary | ICD-10-CM | POA: Diagnosis not present

## 2015-05-09 LAB — CBC WITH DIFFERENTIAL/PLATELET
Basophils Absolute: 0 10*3/uL (ref 0.0–0.1)
Basophils Relative: 1 % (ref 0–1)
EOS ABS: 0.1 10*3/uL (ref 0.0–0.7)
Eosinophils Relative: 2 % (ref 0–5)
HEMATOCRIT: 40.5 % (ref 39.0–52.0)
HEMOGLOBIN: 13.4 g/dL (ref 13.0–17.0)
LYMPHS ABS: 1.8 10*3/uL (ref 0.7–4.0)
LYMPHS PCT: 41 % (ref 12–46)
MCH: 27.7 pg (ref 26.0–34.0)
MCHC: 33.1 g/dL (ref 30.0–36.0)
MCV: 83.9 fL (ref 78.0–100.0)
MPV: 10.2 fL (ref 8.6–12.4)
Monocytes Absolute: 0.5 10*3/uL (ref 0.1–1.0)
Monocytes Relative: 10 % (ref 3–12)
NEUTROS PCT: 46 % (ref 43–77)
Neutro Abs: 2.1 10*3/uL (ref 1.7–7.7)
PLATELETS: 218 10*3/uL (ref 150–400)
RBC: 4.83 MIL/uL (ref 4.22–5.81)
RDW: 13.6 % (ref 11.5–15.5)
WBC: 4.5 10*3/uL (ref 4.0–10.5)

## 2015-05-09 LAB — COMPLETE METABOLIC PANEL WITH GFR
ALBUMIN: 3.9 g/dL (ref 3.6–5.1)
ALK PHOS: 53 U/L (ref 40–115)
ALT: 30 U/L (ref 9–46)
AST: 35 U/L (ref 10–35)
BUN: 19 mg/dL (ref 7–25)
CHLORIDE: 105 mmol/L (ref 98–110)
CO2: 28 mmol/L (ref 20–31)
Calcium: 9.6 mg/dL (ref 8.6–10.3)
Creat: 1.33 mg/dL — ABNORMAL HIGH (ref 0.70–1.25)
GFR, EST NON AFRICAN AMERICAN: 56 mL/min — AB (ref >=60)
GFR, Est African American: 65 mL/min (ref >=60)
GLUCOSE: 88 mg/dL (ref 65–99)
POTASSIUM: 4.8 mmol/L (ref 3.5–5.3)
SODIUM: 141 mmol/L (ref 135–146)
Total Bilirubin: 0.5 mg/dL (ref 0.2–1.2)
Total Protein: 7 g/dL (ref 6.1–8.1)

## 2015-05-09 LAB — POCT URINALYSIS DIP (MANUAL ENTRY)
BILIRUBIN UA: NEGATIVE
Glucose, UA: NEGATIVE
Ketones, POC UA: NEGATIVE
LEUKOCYTES UA: NEGATIVE
NITRITE UA: NEGATIVE
PH UA: 7.5
PROTEIN UA: NEGATIVE
Spec Grav, UA: 1.02
Urobilinogen, UA: 0.2

## 2015-05-09 LAB — LIPID PANEL
CHOL/HDL RATIO: 2.9 ratio (ref <=5.0)
Cholesterol: 202 mg/dL — ABNORMAL HIGH (ref 125–200)
HDL: 69 mg/dL (ref >=40)
LDL CALC: 119 mg/dL (ref <130)
Triglycerides: 70 mg/dL (ref <150)
VLDL: 14 mg/dL (ref <30)

## 2015-05-09 LAB — POC MICROSCOPIC URINALYSIS (UMFC): MUCUS RE: ABSENT

## 2015-05-09 MED ORDER — ZOSTER VACCINE LIVE 19400 UNT/0.65ML ~~LOC~~ SOLR: 0.6500 mL | Freq: Once | SUBCUTANEOUS | Status: DC

## 2015-05-09 MED ORDER — AMLODIPINE BESYLATE 5 MG PO TABS: 5.0000 mg | ORAL_TABLET | Freq: Every day | ORAL | Status: DC

## 2015-05-09 MED ORDER — LISINOPRIL 20 MG PO TABS: ORAL_TABLET | ORAL | Status: DC

## 2015-05-09 MED ORDER — SILDENAFIL CITRATE 100 MG PO TABS: ORAL_TABLET | ORAL | Status: DC

## 2015-05-09 MED ORDER — FINASTERIDE 5 MG PO TABS: 5.0000 mg | ORAL_TABLET | Freq: Every day | ORAL | Status: DC

## 2015-05-09 NOTE — Progress Notes (Signed)
By signing my name below, I, Judithe Modest, attest that this documentation has been prepared under the direction and in the presence of [name], MD. Electronically Signed: Judithe Modest, ER Scribe. 05/09/2015. 2:50 PM.  Chief Complaint:  Chief Complaint  Patient presents with  . Annual Exam    HPI: Darrell Wells is a 65 y.o. male who reports to [facility] today for an annual exam. He states he is well and enjoying retirement. He met a women seven months ago and is really enjoying the relationship. He has a past hx of kidney donation in 1995. He is exercising regularly, running every day and doing pushups and other exercises.    Past Medical History  Diagnosis Date  . Hypertension   . Enlarged prostate    Past Surgical History  Procedure Laterality Date  . 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 Marital Status: Single    Spouse Name: N/A  . Number of Children: N/A  . Years of Education: N/A   Social History Main Topics  . Smoking status: Never Smoker   . Smokeless tobacco: Never Used  . Alcohol Use: Yes     Comment: rare  . Drug Use: No  . Sexual Activity: Yes   Other Topics Concern  . None   Social History Narrative   Family History  Problem Relation Age of Onset  . Colon cancer Neg Hx    No Known Allergies Prior to Admission medications   Medication Sig Start Date End Date Taking? Authorizing Provider  amLODipine (NORVASC) 5 MG tablet Take 1 tablet (5 mg total) by mouth daily. 06/06/14  Yes [name], MD  finasteride (PROSCAR) 5 MG tablet Take 1 tablet (5 mg total) by mouth daily. 05/01/15  Yes [name], MD  lisinopril (PRINIVIL,ZESTRIL) 20 MG tablet TAKE ONE AND ONE-HALF TABLETS DAILY 05/01/15  Yes [name], MD  sildenafil (VIAGRA) 100 MG tablet Take 1/2 to 1 tab by mouth daily as needed for erectile dysfunction. 05/01/15  Yes [name], MD  diazepam (VALIUM) 5 MG tablet Take one half to one  tablet every 6-8 hours as needed for dizziness Patient not taking: Reported on 05/09/2015 08/09/12   [name], MD     ROS: The patient denies fevers, chills, night sweats, unintentional weight loss, chest pain, palpitations, wheezing, dyspnea on exertion, nausea, vomiting, abdominal pain, dysuria, hematuria, melena, numbness, weakness, or tingling.   All other systems have been reviewed and were otherwise negative with the exception of those mentioned in the HPI and as above.    PHYSICAL EXAM: Filed Vitals:   05/09/15 1355  BP: 136/87  Pulse: 73  Temp: 97.9 F (36.6 C)  Resp: 17   Body mass index is 23.52 kg/(m^2).   General: Alert, no acute distress HEENT:  Normocephalic, atraumatic, oropharynx patent. Eye: [name] Cardiovascular:  Regular rate and rhythm, no rubs murmurs or gallops.  No Carotid bruits, radial pulse intact. No pedal edema.  Respiratory: Clear to auscultation bilaterally.  No wheezes, rales, or rhonchi.  No cyanosis, no use of accessory musculature Abdominal: No organomegaly, abdomen is soft and non-tender, positive bowel sounds.  No masses. Musculoskeletal: Gait intact. No edema, tenderness Skin: No rashes. Right nephrectomy scar. Nails on bilateral feet are dystrophic.  Neurologic: Facial musculature symmetric. Psychiatric: Patient acts appropriately throughout our interaction. Lymphatic: No cervical or submandibular lymphadenopathy Anorectal: Prostate exam normal  LABS:   EKG/XRAY:   Primary read interpreted by Dr. Everlene Farrier at [facility].   ASSESSMENT/PLAN: Patient has an incredibly healthy lifestyle. His examination is completely normal. He does not smoke or drink. Blood pressure was at goal. He was given a prescription for shingles vaccine. Flu vaccine was administered today.I personally performed the services described in this documentation, which was scribed in my presence. The recorded information has been reviewed and is accurate.   Gross  sideeffects, risk and benefits, and alternatives of medications d/w patient. Patient is aware that all medications have potential sideeffects and we are unable to predict every sideeffect or drug-drug interaction that may occur.  Arlyss Queen MD 05/09/2015 2:50 PM

## 2015-05-10 LAB — HEPATITIS C ANTIBODY: HCV AB: NEGATIVE

## 2015-05-10 LAB — PSA: PSA: 0.43 ng/mL (ref <=4.00)

## 2015-08-08 ENCOUNTER — Encounter: Admitting: Emergency Medicine

## 2015-08-23 ENCOUNTER — Telehealth: Payer: Self-pay | Admitting: Emergency Medicine

## 2015-08-23 NOTE — Telephone Encounter (Signed)
Patient stated he had his physical in January. Patient is requesting for Viagra 100 MG. Patient want to know if he need an office visit in order to receive the medication. [phone].

## 2015-08-24 ENCOUNTER — Other Ambulatory Visit: Payer: Self-pay | Admitting: Emergency Medicine

## 2015-08-24 DIAGNOSIS — N529 Male erectile dysfunction, unspecified: Secondary | ICD-10-CM

## 2015-08-24 MED ORDER — SILDENAFIL CITRATE 100 MG PO TABS: ORAL_TABLET | ORAL | Status: DC

## 2015-08-24 NOTE — Telephone Encounter (Signed)
Dr. Everlene Farrier  Please see previous message, please let us know if you need [place] to do anything

## 2015-08-24 NOTE — Telephone Encounter (Signed)
Pt. Was informed

## 2015-08-24 NOTE — Telephone Encounter (Signed)
Call patient that I have refilled his prescription

## 2015-12-26 ENCOUNTER — Telehealth: Payer: Self-pay

## 2015-12-26 DIAGNOSIS — N529 Male erectile dysfunction, unspecified: Secondary | ICD-10-CM

## 2015-12-26 NOTE — Telephone Encounter (Signed)
Pt is needing to make sure that his viagra needs to be 18 pills not 15 and 90 day supply for [facility]    [facility] [phone]

## 2015-12-27 NOTE — Telephone Encounter (Signed)
Can we change the sig and send? #54 for a 90 day supply?

## 2015-12-27 NOTE — Telephone Encounter (Signed)
Oh ok, can I send in 18?

## 2015-12-27 NOTE — Telephone Encounter (Signed)
I think the maximum number of Viagra they are allowed  per month is 6 which would mean 18 would be a 3 month supply

## 2015-12-28 MED ORDER — SILDENAFIL CITRATE 100 MG PO TABS: ORAL_TABLET | ORAL | 0 refills | Status: DC

## 2015-12-28 NOTE — Telephone Encounter (Signed)
Pt advised on VM.

## 2015-12-28 NOTE — Telephone Encounter (Signed)
RX sent

## 2015-12-28 NOTE — Telephone Encounter (Signed)
Yes please write the prescription for #18. That will be a 3 month supply.

## 2016-04-18 ENCOUNTER — Encounter: Payer: Self-pay | Admitting: Family Medicine

## 2016-04-18 ENCOUNTER — Ambulatory Visit (INDEPENDENT_AMBULATORY_CARE_PROVIDER_SITE_OTHER): Payer: Medicare Other | Admitting: Family Medicine

## 2016-04-18 VITALS — BP 108/72 | HR 69 | Temp 98.1°F | Resp 16 | Ht 68.0 in | Wt 155.0 lb

## 2016-04-18 DIAGNOSIS — S86912A Strain of unspecified muscle(s) and tendon(s) at lower leg level, left leg, initial encounter: Secondary | ICD-10-CM | POA: Diagnosis not present

## 2016-04-18 DIAGNOSIS — Z23 Encounter for immunization: Secondary | ICD-10-CM

## 2016-04-18 MED ORDER — DICLOFENAC SODIUM 75 MG PO TBEC: 75.0000 mg | DELAYED_RELEASE_TABLET | Freq: Two times a day (BID) | ORAL | 0 refills | Status: DC

## 2016-04-18 NOTE — Patient Instructions (Addendum)
Avoid excessive strain on the left knee over the next couple of weeks  Take diclofenac one twice daily with breakfast and with supper for pain and inflammation  If it is not coming down over the next couple of weeks, or if it is worse before then, call back and we will make a referral to an orthopedic doctor to get it assessed further. The ligaments are a little lax on the lateral portion of the knee (outer portion of the knee) but I think that is just related to the old injury. The joint itself does not have any fluid in it at this time. I am reluctant to inject it with cortisone due to the history of the quadriceps rupture, and take a specialist should be the one to make a decision on that should that become necessary.    IF you received an x-ray today, you will receive an invoice from [facility]. Please contact [facility] at [phone] with questions or concerns regarding your invoice.   IF you received labwork today, you will receive an invoice from [facility]. Please contact [facility] at [phone] with questions or concerns regarding your invoice.   Our billing staff will not be able to assist you with questions regarding bills from these companies.  You will be contacted with the lab results as soon as they are available. The fastest way to get your results is to activate your My Chart account. Instructions are located on the last page of this paperwork. If you have not heard from [place] regarding the results in 2 weeks, please contact this office.

## 2016-04-18 NOTE — Progress Notes (Signed)
Patient ID: Darrell Wells, male    DOB: 02/18/1951  Age: 65 y.o. MRN: [id number]  Chief Complaint  Patient presents with  . Knee Pain    left, difficulty walking last couple of weeks, pain radiates up left side    Subjective:   65 year old man who has started having pain in his left knee. About 4 years ago he ruptured his quadriceps tendon when jumping with a basketball without warming up. This time he knows of no injury. 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 He has not had any swelling of the joint, it just hurts a lot.  Current allergies, medications, problem list, past/family and social histories reviewed.  Objective:  BP 108/72 (BP Location: Right Arm, Patient Position: Sitting, Cuff Size: Normal)   Pulse 69   Temp 98.1 F (36.7 C)   Resp 16   Ht 5\' 8"  (1.727 m)   Wt 155 lb (70.3 kg)   SpO2 100%   BMI 23.57 kg/m   There is laxity of the lateral collateral ligament in the left knee. No effusion. No gross anomaly except for the long midline surgical scar from where he had the quadriceps tendon repair. He has a little anesthesia of the anterior aspect of the knee, with a nerve function never having returned from his surgery.  Assessment & Plan:   Assessment: 1. Need for influenza vaccination   2. Knee strain, left, initial encounter       Plan: See instructions. If he calls back we will go ahead and make a referral to an orthopedic knee specialist.  Orders Placed This Encounter  Procedures  . Flu Vaccine QUAD 36+ mos IM    Meds ordered this encounter  Medications  . diclofenac (VOLTAREN) 75 MG EC tablet    Sig: Take 1 tablet (75 mg total) by mouth 2 (two) times daily.    Dispense:  30 tablet    Refill:  0         Patient Instructions   Avoid excessive strain on the left knee over the next couple of weeks  Take diclofenac one twice daily with breakfast and with supper for pain and inflammation  If it is not coming down over the  next couple of weeks, or if it is worse before then, call back and we will make a referral to an orthopedic doctor to get it assessed further. The ligaments are a little lax on the lateral portion of the knee (outer portion of the knee) but I think that is just related to the old injury. The joint itself does not have any fluid in it at this time. I am reluctant to inject it with cortisone due to the history of the quadriceps rupture, and take a specialist should be the one to make a decision on that should that become necessary.    IF you received an x-ray today, you will receive an invoice from [facility]. Please contact [facility] at [phone] with questions or concerns regarding your invoice.   IF you received labwork today, you will receive an invoice from [facility]. Please contact [facility] at [phone] with questions or concerns regarding your invoice.   Our billing staff will not be able to assist you with questions regarding bills from these companies.  You will be contacted with the lab results as soon as they are available. The fastest way to get your results is to activate your My Chart account. Instructions are located  on the last page of this paperwork. If you have not heard from [place] regarding the results in 2 weeks, please contact this office.         Return if symptoms worsen or fail to improve.   Adithya Difrancesco, MD 04/18/2016

## 2016-05-06 ENCOUNTER — Other Ambulatory Visit: Payer: Self-pay | Admitting: Emergency Medicine

## 2016-05-06 DIAGNOSIS — I10 Essential (primary) hypertension: Secondary | ICD-10-CM

## 2016-05-06 DIAGNOSIS — N529 Male erectile dysfunction, unspecified: Secondary | ICD-10-CM

## 2016-05-06 DIAGNOSIS — N400 Enlarged prostate without lower urinary tract symptoms: Secondary | ICD-10-CM

## 2016-05-16 ENCOUNTER — Encounter: Admitting: Family Medicine

## 2016-05-23 ENCOUNTER — Encounter: Payer: Self-pay | Admitting: Family Medicine

## 2016-05-23 ENCOUNTER — Ambulatory Visit (INDEPENDENT_AMBULATORY_CARE_PROVIDER_SITE_OTHER): Payer: Medicare Other | Admitting: Family Medicine

## 2016-05-23 VITALS — BP 137/79 | HR 82 | Temp 98.3°F | Ht 68.0 in | Wt 160.6 lb

## 2016-05-23 DIAGNOSIS — R351 Nocturia: Secondary | ICD-10-CM | POA: Diagnosis not present

## 2016-05-23 DIAGNOSIS — Z23 Encounter for immunization: Secondary | ICD-10-CM

## 2016-05-23 DIAGNOSIS — Z125 Encounter for screening for malignant neoplasm of prostate: Secondary | ICD-10-CM | POA: Diagnosis not present

## 2016-05-23 DIAGNOSIS — I10 Essential (primary) hypertension: Secondary | ICD-10-CM | POA: Diagnosis not present

## 2016-05-23 DIAGNOSIS — Z13220 Encounter for screening for lipoid disorders: Secondary | ICD-10-CM

## 2016-05-23 DIAGNOSIS — N401 Enlarged prostate with lower urinary tract symptoms: Secondary | ICD-10-CM

## 2016-05-23 DIAGNOSIS — Z0000 Encounter for general adult medical examination without abnormal findings: Secondary | ICD-10-CM | POA: Diagnosis not present

## 2016-05-23 MED ORDER — FINASTERIDE 5 MG PO TABS: 5.0000 mg | ORAL_TABLET | Freq: Every day | ORAL | 1 refills | Status: DC

## 2016-05-23 MED ORDER — ZOSTER VACCINE LIVE 19400 UNT/0.65ML ~~LOC~~ SUSR: 0.6500 mL | Freq: Once | SUBCUTANEOUS | 0 refills | Status: AC

## 2016-05-23 MED ORDER — AMLODIPINE BESYLATE 5 MG PO TABS: 5.0000 mg | ORAL_TABLET | Freq: Every day | ORAL | 1 refills | Status: DC

## 2016-05-23 MED ORDER — LISINOPRIL 20 MG PO TABS: 20.0000 mg | ORAL_TABLET | Freq: Every day | ORAL | 1 refills | Status: DC
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
## 2016-05-23 NOTE — Patient Instructions (Addendum)
Thank you for coming in today and I appreciate your patience.  Okay to try just 20 mg of lisinopril once per day. Keep an eye on your blood pressure and if running over 140/90 consistently, we may need to increase that dose back up to 1 and 1/2 per day. Let me know if that occurs.  If you feel you are having difficulty with hearing, I would recommend an audiogram or hearing test. I can refer you or may be able to schedule on your own. Let me know if a referral is needed.   Schedule appointment with eye care provider. [facility] or Dr. Jerline Pain at [facility] are a few options.    Keeping you healthy  Get these tests  Blood pressure- Have your blood pressure checked once a year by your healthcare provider.  Normal blood pressure is 120/80  Weight- Have your body mass index (BMI) calculated to screen for obesity.  BMI is a measure of body fat based on height and weight. You can also calculate your own BMI at [email or web address].  Cholesterol- Have your cholesterol checked every year.  Diabetes- Have your blood sugar checked regularly if you have high blood pressure, high cholesterol, have a family history of diabetes or if you are overweight.  Screening for Colon Cancer- Colonoscopy starting at age 29.  Screening may begin sooner depending on your family history and other health conditions. Follow up colonoscopy as directed by your Gastroenterologist.  Screening for Prostate Cancer- Both blood work (PSA) and a rectal exam help screen for Prostate Cancer.  Screening begins at age 107 with African-American men and at age 24 with Caucasian men.  Screening may begin sooner depending on your family history.  Take these medicines  Aspirin- One aspirin daily can help prevent Heart disease and Stroke.  Flu shot- Every fall.  Tetanus- Every 10 years.  Zostavax- Once after the age of 11 to prevent Shingles.  Pneumonia shot- Once after the age of 1; if you are younger than 28, ask your  healthcare provider if you need a Pneumonia shot.  Take these steps  Don't smoke- If you do smoke, talk to your doctor about quitting.  For tips on how to quit, go to www.smokefree.gov or call 1-800-QUIT-NOW.  Be physically active- Exercise 5 days a week for at least 30 minutes.  If you are not already physically active start slow and gradually work up to 30 minutes of moderate physical activity.  Examples of moderate activity include walking briskly, mowing the yard, dancing, swimming, bicycling, etc.  Eat a healthy diet- Eat a variety of healthy food such as fruits, vegetables, low fat milk, low fat cheese, yogurt, lean meant, poultry, fish, beans, tofu, etc. For more information go to www.thenutritionsource.org  Drink alcohol in moderation- Limit alcohol intake to less than two drinks a day. Never drink and drive.  Dentist- Brush and floss twice daily; visit your dentist twice a year.  Depression- Your emotional health is as important as your physical health. If you're feeling down, or losing interest in things you would normally enjoy please talk to your healthcare provider.  Eye exam- Visit your eye doctor every year.  Safe sex- If you may be exposed to a sexually transmitted infection, use a condom.  Seat belts- Seat belts can save your life; always wear one.  Smoke/Carbon Monoxide detectors- These detectors need to be installed on the appropriate level of your home.  Replace batteries at least once a year.  Skin cancer- When  out in the sun, cover up and use sunscreen 15 SPF or higher.  Violence- If anyone is threatening you, please tell your healthcare provider.  Living Will/ Health care power of attorney- Speak with your healthcare provider and family.  IF you received an x-ray today, you will receive an invoice from [facility]. Please contact [facility] at [phone] with questions or concerns regarding your invoice.   IF you received labwork today, you  will receive an invoice from [place]. Please contact [facility] at [phone] with questions or concerns regarding your invoice.   Our billing staff will not be able to assist you with questions regarding bills from these companies.  You will be contacted with the lab results as soon as they are available. The fastest way to get your results is to activate your My Chart account. Instructions are located on the last page of this paperwork. If you have not heard from [place] regarding the results in 2 weeks, please contact this office.

## 2016-08-21 ENCOUNTER — Other Ambulatory Visit: Payer: Self-pay | Admitting: Family Medicine

## 2016-08-21 DIAGNOSIS — N529 Male erectile dysfunction, unspecified: Secondary | ICD-10-CM

## 2016-10-31 ENCOUNTER — Ambulatory Visit (INDEPENDENT_AMBULATORY_CARE_PROVIDER_SITE_OTHER): Payer: Medicare Other | Admitting: Family Medicine

## 2016-10-31 ENCOUNTER — Encounter: Payer: Self-pay | Admitting: Family Medicine

## 2016-10-31 ENCOUNTER — Ambulatory Visit (INDEPENDENT_AMBULATORY_CARE_PROVIDER_SITE_OTHER): Payer: Medicare Other

## 2016-10-31 VITALS — BP 134/90 | HR 88 | Temp 97.9°F | Resp 16 | Ht 68.0 in | Wt 154.2 lb

## 2016-10-31 DIAGNOSIS — R51 Headache: Secondary | ICD-10-CM

## 2016-10-31 DIAGNOSIS — B351 Tinea unguium: Secondary | ICD-10-CM | POA: Diagnosis not present

## 2016-10-31 DIAGNOSIS — R519 Headache, unspecified: Secondary | ICD-10-CM

## 2016-10-31 DIAGNOSIS — M542 Cervicalgia: Secondary | ICD-10-CM

## 2016-10-31 MED ORDER — IBUPROFEN 600 MG PO TABS: 600.0000 mg | ORAL_TABLET | Freq: Three times a day (TID) | ORAL | 0 refills | Status: DC | PRN

## 2016-10-31 MED ORDER — CYCLOBENZAPRINE HCL 5 MG PO TABS: 5.0000 mg | ORAL_TABLET | Freq: Three times a day (TID) | ORAL | 1 refills | Status: DC | PRN

## 2016-10-31 NOTE — Patient Instructions (Addendum)
[place] with lidocaine or [facility] [phone] Please call Orvan July, Office Manager to set up an appointment Office Hours: [place], [place] [address] (Inside the [facility])    IF you received an x-ray today, you will receive an invoice from [facility] Radiology. Please contact [facility] Radiology at [phone] with questions or concerns regarding your invoice.   IF you received labwork today, you will receive an invoice from [facility]. Please contact [facility] at [phone] with questions or concerns regarding your invoice.   Our billing staff will not be able to assist you with questions regarding bills from these companies.  You will be contacted with the lab results as soon as they are available. The fastest way to get your results is to activate your My Chart account. Instructions are located on the last page of this paperwork. If you have not heard from [place] regarding the results in 2 weeks, please contact this office.     Cervical Strain and Sprain Rehab Ask your health care provider which exercises are safe for you. Do exercises exactly as told by your health care provider and adjust them as directed. It is normal to feel mild stretching, pulling, tightness, or discomfort as you do these exercises, but you should stop right away if you feel sudden pain or your pain gets worse.Do not begin these exercises until told by your health care provider. Stretching and range of motion exercises These exercises warm up your muscles and joints and improve the movement and flexibility of your neck. These exercises also help to relieve pain, numbness, and tingling. Exercise A: Cervical side bend  1. Using good posture, sit on a stable chair or stand up. 2. Without moving your shoulders, slowly tilt your left / right ear to your shoulder until you feel a stretch in your neck muscles. You should be looking straight  ahead. 3. Hold for __________ seconds. 4. Repeat with the other side of your neck. Repeat __________ times. Complete this exercise __________ times a day. Exercise B: Cervical rotation  1. Using good posture, sit on a stable chair or stand up. 2. Slowly turn your head to the side as if you are looking over your left / right shoulder. ? Keep your eyes level with the ground. ? Stop when you feel a stretch along the side and the back of your neck. 3. Hold for __________ seconds. 4. Repeat this by turning to your other side. Repeat __________ times. Complete this exercise __________ times a day. Exercise C: Thoracic extension and pectoral stretch 1. Roll a towel or a small blanket so it is about 4 inches (10 cm) in diameter. 2. Lie down on your back on a firm surface. 3. Put the towel lengthwise, under your spine in the middle of your back. It should not be not under your shoulder blades. The towel should line up with your spine from your middle back to your lower back. 4. Put your hands behind your head and let your elbows fall out to your sides. 5. Hold for __________ seconds. Repeat __________ times. Complete this exercise __________ times a day. Strengthening exercises These exercises build strength and endurance in your neck. Endurance is the ability to use your muscles for a long time, even after your muscles get tired. Exercise D: Upper cervical flexion, isometric 1. Lie on your back with a thin pillow behind your head and a small rolled-up towel under your neck. 2. Gently tuck your  chin toward your chest and nod your head down to look toward your feet. Do not lift your head off the pillow. 3. Hold for __________ seconds. 4. Release the tension slowly. Relax your neck muscles completely before you repeat this exercise. Repeat __________ times. Complete this exercise __________ times a day. Exercise E: Cervical extension, isometric  1. Stand about 6 inches (15 cm) away from a wall,  with your back facing the wall. 2. Place a soft object, about 6-8 inches (15-20 cm) in diameter, between the back of your head and the wall. A soft object could be a small pillow, a ball, or a folded towel. 3. Gently tilt your head back and press into the soft object. Keep your jaw and forehead relaxed. 4. Hold for __________ seconds. 5. Release the tension slowly. Relax your neck muscles completely before you repeat this exercise. Repeat __________ times. Complete this exercise __________ times a day. Posture and body mechanics  Body mechanics refers to the movements and positions of your body while you do your daily activities. Posture is part of body mechanics. Good posture and healthy body mechanics can help to relieve stress in your body's tissues and joints. Good posture means that your spine is in its natural S-curve position (your spine is neutral), your shoulders are pulled back slightly, and your head is not tipped forward. The following are general guidelines for applying improved posture and body mechanics to your everyday activities. Standing  When standing, keep your spine neutral and keep your feet about hip-width apart. Keep a slight bend in your knees. Your ears, shoulders, and hips should line up.  When you do a task in which you stand in one place for a long time, place one foot up on a stable object that is 2-4 inches (5-10 cm) high, such as a footstool. This helps keep your spine neutral. Sitting   When sitting, keep your spine neutral and your keep feet flat on the floor. Use a footrest, if necessary, and keep your thighs parallel to the floor. Avoid rounding your shoulders, and avoid tilting your head forward.  When working at a desk or a computer, keep your desk at a height where your hands are slightly lower than your elbows. Slide your chair under your desk so you are close enough to maintain good posture.  When working at a computer, place your monitor at a height  where you are looking straight ahead and you do not have to tilt your head forward or downward to look at the screen. Resting When lying down and resting, avoid positions that are most painful for you. Try to support your neck in a neutral position. You can use a contour pillow or a small rolled-up towel. Your pillow should support your neck but not push on it. This information is not intended to replace advice given to you by your health care provider. Make sure you discuss any questions you have with your health care provider. Document Released: 04/15/2005 Document Revised: 12/21/2015 Document Reviewed: 03/22/2015 Elsevier Interactive Patient Education  [employer].

## 2016-10-31 NOTE — Progress Notes (Signed)
Chief Complaint  Patient presents with  . Sore Throat    x tuesday  . Headache    head is sore, feels like a hot iron to head, pain in frontal area.  Works in [employer]    HPI  Pt reports that since Tuesday, 2 days ago, he has been having headaches that are 7/10 in the back of the head and at both sides. He reports that the pain is dull and achy in character.  He also has a sore throat but no cough or postnasal drip.  He is outdoors a lot but does not take allergy medication.  Pain is 7/10 Tried tylenol 500mg     He also reports that he has some neck pain and shoulder pain.  He denies neck stiffness. He has photophobia He denies nausea, vomiting or visual changes    Past Medical History:  Diagnosis Date  . Enlarged prostate   . Hypertension     Current Outpatient Prescriptions  Medication Sig Dispense Refill  . finasteride (PROSCAR) 5 MG tablet Take 1 tablet (5 mg total) by mouth daily. 90 tablet 1  . VIAGRA 100 MG tablet TAKE ONE-HALF (1/2) TO ONE TABLET DAILY AS NEEDED FOR ERECTILE DYSFUNCTION 18 tablet 0  . amLODipine (NORVASC) 5 MG tablet Take 1 tablet (5 mg total) by mouth daily. (Patient not taking: Reported on 10/31/2016) 90 tablet 1  . cyclobenzaprine (FLEXERIL) 5 MG tablet Take 1 tablet (5 mg total) by mouth 3 (three) times daily as needed for muscle spasms. 30 tablet 1  . ibuprofen (ADVIL,MOTRIN) 600 MG tablet Take 1 tablet (600 mg total) by mouth every 8 (eight) hours as needed. 30 tablet 0  . lisinopril (PRINIVIL,ZESTRIL) 20 MG tablet Take 1 tablet (20 mg total) by mouth daily. (Patient not taking: Reported on 10/31/2016) 90 tablet 1   No current facility-administered medications for this visit.     Allergies: Not on File  Past Surgical History:  Procedure Laterality Date  . 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 Marital status: Single    Spouse name: N/A  . Number of children: N/A  . Years of education:  N/A   Social History Main Topics  . Smoking status: Never Smoker  . Smokeless tobacco: Never Used  . Alcohol use Yes     Comment: rare  . Drug use: No  . Sexual activity: Yes   Other Topics Concern  . None   Social History Narrative  . None    ROS See hpi  Objective: Vitals:   10/31/16 1154 10/31/16 1200  BP: (!) 147/95 134/90  Pulse: 88   Resp: 16   Temp: 97.9 F (36.6 C)   TempSrc: Oral   SpO2: 98%   Weight: 154 lb 3.2 oz (69.9 kg)   Height: 5\' 8"  (1.727 m)     Physical Exam  Constitutional: He is oriented to person, place, and time. He appears well-developed and well-nourished.  HENT:  Head: Normocephalic and atraumatic.  Eyes: Conjunctivae and EOM are normal. Pupils are equal, round, and reactive to light.  Limited fundoscopic exam unable to achieve full dilation Blood vessels that were visualized were normal  Musculoskeletal:  Exam upright showed bilateral trapezius muscle spasms.  With patient supine noted that pt is very uncomfortable with tenderness over the cervical spine.  Neurological: He is alert and oriented to person, place, and time.   Mild degenerative facet spurring on  the left at C4-5 and C5-6. Mild C3-4 disc narrowing. Mild left bony foraminal narrowing at C4-5. There is no evidence of fracture, endplate erosion, or focal bone lesion. Mild carotid atherosclerotic calcifications. No prevertebral thickening.  IMPRESSION: 1. No acute finding. 2. Mild degenerative changes are noted above.   Electronically Signed   By: Monte Fantasia M.D.   On: 10/31/2016 13:08   Assessment and Plan [place] was seen today for sore throat and headache.  Diagnoses and all orders for this visit:  New onset headache-  Xray without gross abnormalities  -     DG Cervical Spine Complete -     ibuprofen (ADVIL,MOTRIN) 600 MG tablet; Take 1 tablet (600 mg total) by mouth every 8 (eight) hours as needed. -     cyclobenzaprine (FLEXERIL) 5 MG tablet; Take 1  tablet (5 mg total) by mouth 3 (three) times daily as needed for muscle spasms.  Neck pain- discussed that with her trapezius muscle spasm it is likely musculoskeletal -     DG Cervical Spine Complete  Onychomycosis of toenail- referral to podiatry -     Ambulatory referral to [place]

## 2016-11-07 ENCOUNTER — Ambulatory Visit (INDEPENDENT_AMBULATORY_CARE_PROVIDER_SITE_OTHER): Payer: Medicare Other | Admitting: Podiatry

## 2016-11-07 ENCOUNTER — Encounter: Payer: Self-pay | Admitting: Podiatry

## 2016-11-07 VITALS — BP 149/96 | HR 90 | Ht 68.0 in | Wt 154.0 lb

## 2016-11-07 DIAGNOSIS — B353 Tinea pedis: Secondary | ICD-10-CM

## 2016-11-07 DIAGNOSIS — B351 Tinea unguium: Secondary | ICD-10-CM

## 2016-11-07 DIAGNOSIS — M79671 Pain in right foot: Secondary | ICD-10-CM

## 2016-11-07 DIAGNOSIS — R234 Changes in skin texture: Secondary | ICD-10-CM | POA: Diagnosis not present

## 2016-11-07 DIAGNOSIS — M79672 Pain in left foot: Secondary | ICD-10-CM

## 2016-11-07 MED ORDER — TERBINAFINE HCL 250 MG PO TABS: 250.0000 mg | ORAL_TABLET | Freq: Every day | ORAL | 2 refills | Status: DC

## 2016-11-07 NOTE — Progress Notes (Signed)
SUBJECTIVE: 66 y.o. year old male presents for problematic thick and discolored toe nails and cracking skin in between digits for 10-15 years. Got worse while in [employer]. Patient runs daily.  REVIEW OF SYSTEMS: Pertinent items noted in HPI and remainder of comprehensive ROS otherwise negative.  OBJECTIVE: DERMATOLOGIC EXAMINATION: Nails: Thick dystrophic hallucal nails and 5th toe nails with fungal debris.  Skin Integrity: Fissured callus under the sulcus of 4th and 5th digits bilateral. White discolored 4th interdigital space bilateral.  VASCULAR EXAMINATION OF LOWER LIMBS: All pedal pulses are palpable with normal pulsation.  Capillary Filling times within 3 seconds in all digits.  No edema or erythema noted. Temperature gradient from tibial crest to dorsum of foot is within normal bilateral.  NEUROLOGIC EXAMINATION OF THE LOWER LIMBS: All epicritic and tactile sensations grossly intact.  MUSCULOSKELETAL EXAMINATION: No gross deformities noted.  ASSESSMENT: Tinea pedis 4th interdigital space bilateral. Deformed nail with onychomycosis both great toes and 5th digits bilateral. Fissured callus under 4th and 5th digital sulcus bilaeral.  PLAN: Reviewed findings and available treatment options. All lesions and nails debrided. Hepatic panel blood work ordered. Lamisil prescribed. Return in 6 weeks.

## 2016-11-07 NOTE — Patient Instructions (Signed)
Seen for problematic fungal nails and skin lesions in between digits. All nails and cracking calluses debrided. Will do blood work and prescribe Lamisil 250. Return in 6 weeks.

## 2016-11-14 DIAGNOSIS — Z79899 Other long term (current) drug therapy: Secondary | ICD-10-CM | POA: Diagnosis not present

## 2016-11-19 ENCOUNTER — Other Ambulatory Visit: Payer: Self-pay | Admitting: Family Medicine

## 2016-11-19 DIAGNOSIS — I10 Essential (primary) hypertension: Secondary | ICD-10-CM

## 2016-11-20 ENCOUNTER — Other Ambulatory Visit: Payer: Self-pay | Admitting: Physician Assistant

## 2016-11-20 DIAGNOSIS — N529 Male erectile dysfunction, unspecified: Secondary | ICD-10-CM

## 2016-12-19 ENCOUNTER — Ambulatory Visit: Payer: Medicare Other | Admitting: Podiatry

## 2016-12-24 ENCOUNTER — Ambulatory Visit (INDEPENDENT_AMBULATORY_CARE_PROVIDER_SITE_OTHER): Payer: Medicare Other | Admitting: Podiatry

## 2016-12-24 ENCOUNTER — Encounter: Payer: Self-pay | Admitting: Podiatry

## 2016-12-24 DIAGNOSIS — M79672 Pain in left foot: Secondary | ICD-10-CM

## 2016-12-24 DIAGNOSIS — M79671 Pain in right foot: Secondary | ICD-10-CM

## 2016-12-24 DIAGNOSIS — B353 Tinea pedis: Secondary | ICD-10-CM | POA: Diagnosis not present

## 2016-12-24 DIAGNOSIS — R234 Changes in skin texture: Secondary | ICD-10-CM

## 2016-12-24 DIAGNOSIS — B351 Tinea unguium: Secondary | ICD-10-CM | POA: Diagnosis not present

## 2016-12-24 NOTE — Progress Notes (Signed)
SUBJECTIVE: 66 y.o. year old male presents for follow up on fungal nails. Been taking Lamisil tablet daily x 6 weeks after he had done blood work, which was normal on hepatic panel. Stated that he was able to see some improvement on in between the digits and nails.   History of problematic thick and discolored toe nails and cracking skin in between digits for 10-15 years.  Got worse while in [employer]. Patient runs daily.  OBJECTIVE: DERMATOLOGIC EXAMINATION: Thick dystrophic hallucal nails and 5th toe nails with fungal debris.  Skin Integrity: Fissured callus under the sulcus of 4th and 5th digits bilateral. White discolored 4th interdigital space bilateral.  VASCULAR EXAMINATION OF LOWER LIMBS: All pedal pulses are palpable with normal pulsation.  Capillary Filling times within 3 seconds in all digits.  No edema or erythema noted. Temperature gradient from tibial crest to dorsum of foot is within normal bilateral.  NEUROLOGIC EXAMINATION OF THE LOWER LIMBS: All epicritic and tactile sensations grossly intact.  MUSCULOSKELETAL EXAMINATION: No gross deformities noted.  ASSESSMENT: Tinea pedis 4th interdigital space bilateral, improved. Deformed nail with onychomycosis both great toes and 5th digits bilateral. Fissured callus under 4th and 5th digital sulcus bilaeral.  PLAN: Reviewed findings and available treatment options. All lesions and nails debrided. Hepatic panel blood work to be repeated. Continue with Lamisil for another 6 weeks. Return in 2 month.

## 2016-12-24 NOTE — Patient Instructions (Signed)
Follow up on fungal nails and tinea pedis. All nails and interdigital lesions debrided. Return in 2 months.

## 2017-01-08 DIAGNOSIS — B351 Tinea unguium: Secondary | ICD-10-CM | POA: Diagnosis not present

## 2017-01-22 ENCOUNTER — Other Ambulatory Visit: Payer: Self-pay | Admitting: Family Medicine

## 2017-01-22 DIAGNOSIS — R351 Nocturia: Secondary | ICD-10-CM

## 2017-01-22 DIAGNOSIS — N401 Enlarged prostate with lower urinary tract symptoms: Secondary | ICD-10-CM

## 2017-01-22 DIAGNOSIS — I10 Essential (primary) hypertension: Secondary | ICD-10-CM

## 2017-02-25 ENCOUNTER — Encounter: Payer: Self-pay | Admitting: Podiatry

## 2017-02-25 ENCOUNTER — Ambulatory Visit (INDEPENDENT_AMBULATORY_CARE_PROVIDER_SITE_OTHER): Payer: Medicare Other | Admitting: Podiatry

## 2017-02-25 DIAGNOSIS — M79671 Pain in right foot: Secondary | ICD-10-CM | POA: Diagnosis not present

## 2017-02-25 DIAGNOSIS — M79672 Pain in left foot: Secondary | ICD-10-CM | POA: Diagnosis not present

## 2017-02-25 DIAGNOSIS — R234 Changes in skin texture: Secondary | ICD-10-CM | POA: Diagnosis not present

## 2017-02-25 DIAGNOSIS — B351 Tinea unguium: Secondary | ICD-10-CM | POA: Diagnosis not present

## 2017-02-25 DIAGNOSIS — B353 Tinea pedis: Secondary | ICD-10-CM

## 2017-02-25 NOTE — Progress Notes (Signed)
Subjective: 66 y.o. year old male patient presents follow up on fungal infected toe nails. He is noticing improvement since been treated with Lamisil. He has taken about 2 month now following blood work.  Objective: Dermatologic: Thick yellow deformed nails both great toes. Thick fissurating callused skin under 4th web space bilateral. Vascular: Pedal pulses are all palpable. Orthopedic: No gross deformities. Neurologic: All epicritic and tactile sensations grossly intact.  Assessment: Dystrophic mycotic nails both great toes.  Treatment: All mycotic nails debrided.  Patient will use Vitamin A cream. Return in 2 months.

## 2017-02-25 NOTE — Patient Instructions (Signed)
Doing well with Lamisil and showing improvement. Still having dry skin problem. Nails debrided. May benefit from Vitamin A cream for dry skin. Return in 2 months.

## 2017-05-19 ENCOUNTER — Other Ambulatory Visit: Payer: Self-pay | Admitting: Family Medicine

## 2017-05-19 DIAGNOSIS — I10 Essential (primary) hypertension: Secondary | ICD-10-CM

## 2017-05-26 ENCOUNTER — Ambulatory Visit (INDEPENDENT_AMBULATORY_CARE_PROVIDER_SITE_OTHER): Payer: Medicare Other | Admitting: Family Medicine

## 2017-05-26 ENCOUNTER — Ambulatory Visit: Payer: Medicare Other

## 2017-05-26 ENCOUNTER — Other Ambulatory Visit: Payer: Self-pay

## 2017-05-26 ENCOUNTER — Encounter: Payer: Self-pay | Admitting: Family Medicine

## 2017-05-26 VITALS — BP 132/80 | HR 71 | Temp 97.7°F | Resp 18 | Ht 67.72 in | Wt 160.0 lb

## 2017-05-26 DIAGNOSIS — Z0000 Encounter for general adult medical examination without abnormal findings: Secondary | ICD-10-CM

## 2017-05-26 DIAGNOSIS — N401 Enlarged prostate with lower urinary tract symptoms: Secondary | ICD-10-CM | POA: Diagnosis not present

## 2017-05-26 DIAGNOSIS — Z23 Encounter for immunization: Secondary | ICD-10-CM | POA: Diagnosis not present

## 2017-05-26 DIAGNOSIS — R351 Nocturia: Secondary | ICD-10-CM | POA: Diagnosis not present

## 2017-05-26 DIAGNOSIS — E785 Hyperlipidemia, unspecified: Secondary | ICD-10-CM

## 2017-05-26 DIAGNOSIS — Z125 Encounter for screening for malignant neoplasm of prostate: Secondary | ICD-10-CM | POA: Diagnosis not present

## 2017-05-26 DIAGNOSIS — N529 Male erectile dysfunction, unspecified: Secondary | ICD-10-CM | POA: Diagnosis not present

## 2017-05-26 DIAGNOSIS — I10 Essential (primary) hypertension: Secondary | ICD-10-CM

## 2017-05-26 MED ORDER — LISINOPRIL 20 MG PO TABS: 20.0000 mg | ORAL_TABLET | Freq: Every day | ORAL | 3 refills | Status: DC

## 2017-05-26 MED ORDER — SILDENAFIL CITRATE 100 MG PO TABS: ORAL_TABLET | ORAL | 3 refills | Status: DC

## 2017-05-26 MED ORDER — FINASTERIDE 5 MG PO TABS: 5.0000 mg | ORAL_TABLET | Freq: Every day | ORAL | 3 refills | Status: DC

## 2017-05-26 MED ORDER — ZOSTER VAC RECOMB ADJUVANTED 50 MCG/0.5ML IM SUSR: 0.5000 mL | Freq: Once | INTRAMUSCULAR | 1 refills | Status: AC

## 2017-05-26 NOTE — Patient Instructions (Addendum)
Ok to stop amlodipine. If blood pressure running higher, let me know and I can restart that med.   Schedule eye care provider appointment, and bring copy of living will.   If blood sugar or cholesterol is elevated on blood work, can recheck after 8 hours fasting as lab only visit.    Preventive Care 67 Years and Older, Male Preventive care refers to lifestyle choices and visits with your health care provider that can promote health and wellness. What does preventive care include?  A yearly physical exam. This is also called an annual well check.  Dental exams once or twice a year.  Routine eye exams. Ask your health care provider how often you should have your eyes checked.  Personal lifestyle choices, including: ? Daily care of your teeth and gums. ? Regular physical activity. ? Eating a healthy diet. ? Avoiding tobacco and drug use. ? Limiting alcohol use. ? Practicing safe sex. ? Taking low doses of aspirin every day. ? Taking vitamin and mineral supplements as recommended by your health care provider. What happens during an annual well check? The services and screenings done by your health care provider during your annual well check will depend on your age, overall health, lifestyle risk factors, and family history of disease. Counseling Your health care provider may ask you questions about your:  Alcohol use.  Tobacco use.  Drug use.  Emotional well-being.  Home and relationship well-being.  Sexual activity.  Eating habits.  History of falls.  Memory and ability to understand (cognition).  Work and work Statistician.  Screening You may have the following tests or measurements:  Height, weight, and BMI.  Blood pressure.  Lipid and cholesterol levels. These may be checked every 5 years, or more frequently if you are over 57 years old.  Skin check.  Lung cancer screening. You may have this screening every year starting at age 42 if you have a  30-pack-year history of smoking and currently smoke or have quit within the past 15 years.  Fecal occult blood test (FOBT) of the stool. You may have this test every year starting at age 75.  Flexible sigmoidoscopy or colonoscopy. You may have a sigmoidoscopy every 5 years or a colonoscopy every 10 years starting at age 42.  Prostate cancer screening. Recommendations will vary depending on your family history and other risks.  Hepatitis C blood test.  Hepatitis B blood test.  Sexually transmitted disease (STD) testing.  Diabetes screening. This is done by checking your blood sugar (glucose) after you have not eaten for a while (fasting). You may have this done every 1-3 years.  Abdominal aortic aneurysm (AAA) screening. You may need this if you are a current or former smoker.  Osteoporosis. You may be screened starting at age 80 if you are at high risk.  Talk with your health care provider about your test results, treatment options, and if necessary, the need for more tests. Vaccines Your health care provider may recommend certain vaccines, such as:  Influenza vaccine. This is recommended every year.  Tetanus, diphtheria, and acellular pertussis (Tdap, Td) vaccine. You may need a Td booster every 10 years.  Varicella vaccine. You may need this if you have not been vaccinated.  Zoster vaccine. You may need this after age 53.  Measles, mumps, and rubella (MMR) vaccine. You may need at least one dose of MMR if you were born in 1957 or later. You may also need a second dose.  Pneumococcal 13-valent conjugate (  PCV13) vaccine. One dose is recommended after age 34.  Pneumococcal polysaccharide (PPSV23) vaccine. One dose is recommended after age 49.  Meningococcal vaccine. You may need this if you have certain conditions.  Hepatitis A vaccine. You may need this if you have certain conditions or if you travel or work in places where you may be exposed to hepatitis A.  Hepatitis B  vaccine. You may need this if you have certain conditions or if you travel or work in places where you may be exposed to hepatitis B.  Haemophilus influenzae type b (Hib) vaccine. You may need this if you have certain risk factors.  Talk to your health care provider about which screenings and vaccines you need and how often you need them. This information is not intended to replace advice given to you by your health care provider. Make sure you discuss any questions you have with your health care provider. Document Released: 05/12/2015 Document Revised: 01/03/2016 Document Reviewed: 02/14/2015 Elsevier Interactive Patient Education  2018 [employer].     IF you received an x-ray today, you will receive an invoice from [facility] Radiology. Please contact [facility] Radiology at [phone] with questions or concerns regarding your invoice.   IF you received labwork today, you will receive an invoice from [place] [facility]. Please contact [facility] at [phone] with questions or concerns regarding your invoice.   Our billing staff will not be able to assist you with questions regarding bills from these companies.  You will be contacted with the lab results as soon as they are available. The fastest way to get your results is to activate your My Chart account. Instructions are located on the last page of this paperwork. If you have not heard from [place] regarding the results in 2 weeks, please contact this office.

## 2017-05-26 NOTE — Progress Notes (Signed)
Subjective:  By signing my name below, I, Darrell Wells, attest that this documentation has been prepared under the direction and in the presence of Darrell Agreste, MD Electronically Signed: Ladene Artist, ED Scribe 05/26/2017 at 3:39 PM.   Patient ID: Darrell Wells, male    DOB: May 03, 1950, 67 y.Wells.   MRN: [id number]  Chief Complaint  Patient presents with  . Annual Exam   HPI Darrell Wells is a 67 y.Wells. male who presents to Primary Care at [facility] for an annual exam. Last seen 04/2016. Planned for fasting labs after last visit; has not had those done. Pt last ate around 10 AM today.  H/Wells HTN Takes amlodipine and lisinopril. -- Pt states that he has only been taking lisinopril. Checks BP once-twice/wk with readings ranging 117-128/80s-90. Lab Results  Component Value Date   CREATININE 1.33 (H) 05/09/2015   BP Readings from Last 3 Encounters:  05/26/17 132/80  11/07/16 (!) 149/96  10/31/16 134/90    BPH Proscar 5 mg qd.  ED Viagra 50-100 mg qd. Denies cyanopsia, cp on exertion, sob, lightheadedness, any side-effects.  CA Screening Colonoscopy: 02/2013, due this Nov Prostate CA Screening: H/Wells BPH. Occasionally wakes once-twice/night to urinate depending on water consumption. Pt agrees to PSA and DRE today. Lab Results  Component Value Date   PSA 0.43 05/09/2015   PSA 0.50 05/03/2014   PSA 0.48 12/29/2012   Immunizations Immunization History  Administered Date(s) Administered  . Influenza,inj,Quad PF,6+ Mos 05/03/2014, 05/09/2015, 04/18/2016, 05/26/2017  . Pneumococcal Conjugate-13 05/23/2016  Due for pneumovax today. Shingles: sent to pharmacy  Fall Screening No falls in the past yr.  Functional Status Survey: Is the patient deaf or have difficulty hearing?: No Does the patient have difficulty seeing, even when wearing glasses/contacts?: No Does the patient have difficulty concentrating, remembering, or making decisions?: No Does the patient have difficulty  walking or climbing stairs?: No Does the patient have difficulty dressing or bathing?: No Does the patient have difficulty doing errands alone such as visiting a doctor's office or shopping?: No  Mental Status Screening No flowsheet data found.  Depression Screening Depression screen [facility] 2/9 05/26/2017 10/31/2016 05/23/2016 04/18/2016 05/09/2015  Decreased Interest 0 0 0 0 0  Down, Depressed, Hopeless 0 0 0 0 0  PHQ - 2 Score 0 0 0 0 0     Visual Acuity Screening   Right eye Left eye Both eyes  Without correction: 20/30 20/13 20/13-1  With correction:      Vision: has not seen within 2 yrs Dentist: recommended last yr; scheduled for March Exercise: runs daily  Advanced Directives Pt plans to bring a copy of his living will.  Patient Active Problem List   Diagnosis Date Noted  . Essential hypertension, benign 05/03/2014  . BPH (benign prostatic hyperplasia) 05/03/2014  . Erectile dysfunction 05/03/2014   Past Medical History:  Diagnosis Date  . Enlarged prostate   . Hypertension    Past Surgical History:  Procedure Laterality Date  . 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 Authorizing Provider  amLODipine (NORVASC) 5 MG tablet Take 1 tablet (5 mg total) by mouth daily. Office visit needed for 90 day supply 01/23/17   Darrell Agreste, MD  cyclobenzaprine (FLEXERIL) 5 MG tablet Take 1 tablet (5 mg total) by mouth 3 (three) times daily as needed for muscle spasms. 10/31/16   Delia Chimes  A, MD  finasteride (PROSCAR) 5 MG tablet TAKE 1 TABLET DAILY 01/23/17   Darrell Agreste, MD  ibuprofen (ADVIL,MOTRIN) 600 MG tablet Take 1 tablet (600 mg total) by mouth every 8 (eight) hours as needed. 10/31/16   Forrest Moron, MD  lisinopril (PRINIVIL,ZESTRIL) 20 MG tablet TAKE 1 TABLET DAILY 05/19/17   Darrell Agreste, MD  terbinafine (LAMISIL) 250 MG tablet Take 1 tablet (250 mg  total) by mouth daily. 11/07/16   Darrell, Myeong Wells, DPM  VIAGRA 100 MG tablet TAKE ONE-HALF (1/2) TO ONE TABLET DAILY AS NEEDED FOR ERECTILE DYSFUNCTION 11/20/16   Darrell Agreste, MD   Social History   Socioeconomic History  . Marital status: Single    Spouse name: Not on file  . Number of children: Not on file  . Years of education: Not on file  . Highest education level: Not on file  Social Needs  . Financial resource strain: Not on file  . Food insecurity - worry: Not on file  . Food insecurity - inability: Not on file  . Transportation needs - medical: Not on file  . Transportation needs - non-medical: Not on file  Occupational History  . Not on file  Tobacco Use  . Smoking status: Never Smoker  . Smokeless tobacco: Never Used  Substance and Sexual Activity  . Alcohol use: Yes    Comment: rare  . Drug use: No  . Sexual activity: Yes  Other Topics Concern  . Not on file  Social History Narrative  . Not on file   Review of Systems  Eyes: Negative for visual disturbance.  Respiratory: Negative for shortness of breath.   Cardiovascular: Negative for chest pain.  Neurological: Negative for light-headedness.      Objective:   Physical Exam  Constitutional: He is oriented to person, place, and time. He appears well-developed and well-nourished.  HENT:  Head: Normocephalic and atraumatic.  Right Ear: External ear normal.  Left Ear: External ear normal.  Mouth/Throat: Oropharynx is clear and moist.  Eyes: Conjunctivae and EOM are normal. Pupils are equal, round, and reactive to light.  Neck: Normal range of motion. Neck supple. No thyromegaly present.  Cardiovascular: Normal rate, regular rhythm, normal heart sounds and intact distal pulses.  Pulmonary/Chest: Effort normal and breath sounds normal. No respiratory distress. He has no wheezes.  Abdominal: Soft. He exhibits no distension. There is no tenderness. Hernia confirmed negative in the right inguinal area and  confirmed negative in the left inguinal area.  Genitourinary: Prostate normal.  Musculoskeletal: Normal range of motion. He exhibits no edema or tenderness.  Lymphadenopathy:    He has no cervical adenopathy.  Neurological: He is alert and oriented to person, place, and time. He has normal reflexes.  Skin: Skin is warm and dry.  Psychiatric: He has a normal mood and affect. His behavior is normal.  Vitals reviewed.    Vitals:   05/26/17 1454  BP: 132/80  Pulse: 71  Resp: 18  Temp: 97.7 F (36.5 C)  TempSrc: Oral  SpO2: 100%  Weight: 160 lb (72.6 kg)  Height: 5' 7.72" (1.72 m)      Assessment & Plan:    Renner Sebald is a 67 y.Wells. male Medicare annual wellness visit, initial  -  - anticipatory guidance as below in AVS, screening labs if needed. Health maintenance items as above in HPI discussed/recommended as applicable.   - no concerning responses on depression, fall, or functional status screening. Any positive responses  noted as above. Advanced directives discussed as in CHL.   Need for influenza vaccination - Plan: Flu Vaccine QUAD 36+ mos IM  Erectile dysfunction, unspecified erectile dysfunction type - Plan: sildenafil (VIAGRA) 100 MG tablet  -viagra Rx given - use lowest effective dose. Side effects discussed (including but not limited to headache/flushing, blue discoloration of vision, possible vascular steal and risk of cardiac effects if underlying unknown coronary artery disease, and permanent sensorineural hearing loss). Understanding expressed.  Essential hypertension, benign - Plan: lisinopril (PRINIVIL,ZESTRIL) 20 MG tablet, Comprehensive metabolic panel  - appears to be doing well on just lisinopril. Labs pending. Monitor home readings.   Benign prostatic hyperplasia with nocturia - Plan: finasteride (PROSCAR) 5 MG tablet, PSA Screening for prostate cancer - Plan: PSA  - We discussed pros and cons of prostate cancer screening, and after this discussion, he  chose to have screening done. PSA obtained, and no concerning findings on DRE.   Need for prophylactic vaccination against Streptococcus pneumoniae (pneumococcus) - Plan: Pneumococcal polysaccharide vaccine 23-valent greater than or equal to 2yo subcutaneous/IM  Need for shingles vaccine - Plan: Zoster Vaccine Adjuvanted [facility]) injection snet to pharmacy  Hyperlipidemia, unspecified hyperlipidemia type - Plan: Lipid panel  - check labs, then ASCVD risk assessment to determine statin need  Meds ordered this encounter  Medications  . sildenafil (VIAGRA) 100 MG tablet    Sig: TAKE ONE-HALF (1/2) TO ONE TABLET DAILY AS NEEDED FOR ERECTILE DYSFUNCTION    Dispense:  18 tablet    Refill:  3  . lisinopril (PRINIVIL,ZESTRIL) 20 MG tablet    Sig: Take 1 tablet (20 mg total) by mouth daily.    Dispense:  90 tablet    Refill:  3  . finasteride (PROSCAR) 5 MG tablet    Sig: Take 1 tablet (5 mg total) by mouth daily.    Dispense:  90 tablet    Refill:  3  . Zoster Vaccine Adjuvanted [facility]) injection    Sig: Inject 0.5 mLs into the muscle once for 1 dose. Repeat in 2-6 months.    Dispense:  0.5 mL    Refill:  1   Patient Instructions   Ok to stop amlodipine. If blood pressure running higher, let me know and I can restart that med.   Schedule eye care provider appointment, and bring copy of living will.   If blood sugar or cholesterol is elevated on blood work, can recheck after 8 hours fasting as lab only visit.    Preventive Care 5 Years and Older, Male Preventive care refers to lifestyle choices and visits with your health care provider that can promote health and wellness. What does preventive care include?  A yearly physical exam. This is also called an annual well check.  Dental exams once or twice a year.  Routine eye exams. Ask your health care provider how often you should have your eyes checked.  Personal lifestyle choices, including: ? Daily care of your teeth and  gums. ? Regular physical activity. ? Eating a healthy diet. ? Avoiding tobacco and drug use. ? Limiting alcohol use. ? Practicing safe sex. ? Taking low doses of aspirin every day. ? Taking vitamin and mineral supplements as recommended by your health care provider. What happens during an annual well check? The services and screenings done by your health care provider during your annual well check will depend on your age, overall health, lifestyle risk factors, and family history of disease. Counseling Your health care provider may ask  you questions about your:  Alcohol use.  Tobacco use.  Drug use.  Emotional well-being.  Home and relationship well-being.  Sexual activity.  Eating habits.  History of falls.  Memory and ability to understand (cognition).  Work and work Statistician.  Screening You may have the following tests or measurements:  Height, weight, and BMI.  Blood pressure.  Lipid and cholesterol levels. These may be checked every 5 years, or more frequently if you are over 106 years old.  Skin check.  Lung cancer screening. You may have this screening every year starting at age 74 if you have a 30-pack-year history of smoking and currently smoke or have quit within the past 15 years.  Fecal occult blood test (FOBT) of the stool. You may have this test every year starting at age 59.  Flexible sigmoidoscopy or colonoscopy. You may have a sigmoidoscopy every 5 years or a colonoscopy every 10 years starting at age 83.  Prostate cancer screening. Recommendations will vary depending on your family history and other risks.  Hepatitis C blood test.  Hepatitis B blood test.  Sexually transmitted disease (STD) testing.  Diabetes screening. This is done by checking your blood sugar (glucose) after you have not eaten for a while (fasting). You may have this done every 1-3 years.  Abdominal aortic aneurysm (AAA) screening. You may need this if you are a  current or former smoker.  Osteoporosis. You may be screened starting at age 79 if you are at high risk.  Talk with your health care provider about your test results, treatment options, and if necessary, the need for more tests. Vaccines Your health care provider may recommend certain vaccines, such as:  Influenza vaccine. This is recommended every year.  Tetanus, diphtheria, and acellular pertussis (Tdap, Td) vaccine. You may need a Td booster every 10 years.  Varicella vaccine. You may need this if you have not been vaccinated.  Zoster vaccine. You may need this after age 77.  Measles, mumps, and rubella (MMR) vaccine. You may need at least one dose of MMR if you were born in 1957 or later. You may also need a second dose.  Pneumococcal 13-valent conjugate (PCV13) vaccine. One dose is recommended after age 51.  Pneumococcal polysaccharide (PPSV23) vaccine. One dose is recommended after age 53.  Meningococcal vaccine. You may need this if you have certain conditions.  Hepatitis A vaccine. You may need this if you have certain conditions or if you travel or work in places where you may be exposed to hepatitis A.  Hepatitis B vaccine. You may need this if you have certain conditions or if you travel or work in places where you may be exposed to hepatitis B.  Haemophilus influenzae type b (Hib) vaccine. You may need this if you have certain risk factors.  Talk to your health care provider about which screenings and vaccines you need and how often you need them. This information is not intended to replace advice given to you by your health care provider. Make sure you discuss any questions you have with your health care provider. Document Released: 05/12/2015 Document Revised: 01/03/2016 Document Reviewed: 02/14/2015 Elsevier Interactive Patient Education  2018 [employer].     IF you received an x-ray today, you will receive an invoice from [facility]. Please contact  [facility] at [phone] with questions or concerns regarding your invoice.   IF you received labwork today, you will receive an invoice from [place]. Please contact [facility] at [phone] with  questions or concerns regarding your invoice.   Our billing staff will not be able to assist you with questions regarding bills from these companies.  You will be contacted with the lab results as soon as they are available. The fastest way to get your results is to activate your My Chart account. Instructions are located on the last page of this paperwork. If you have not heard from [place] regarding the results in 2 weeks, please contact this office.     'I personally performed the services described in this documentation, which was scribed in my presence. The recorded information has been reviewed and considered for accuracy and completeness, addended by me as needed, and agree with information above.  Signed,   Merri Ray, MD [facility] [place].  05/28/17 9:47 PM

## 2017-05-27 LAB — LIPID PANEL
CHOL/HDL RATIO: 2.6 ratio (ref 0.0–5.0)
Cholesterol, Total: 200 mg/dL — ABNORMAL HIGH (ref 100–199)
HDL: 76 mg/dL (ref >39)
LDL Calculated: 104 mg/dL — ABNORMAL HIGH (ref 0–99)
TRIGLYCERIDES: 99 mg/dL (ref 0–149)
VLDL Cholesterol Cal: 20 mg/dL (ref 5–40)

## 2017-05-27 LAB — COMPREHENSIVE METABOLIC PANEL
ALT: 19 IU/L (ref 0–44)
AST: 35 IU/L (ref 0–40)
Albumin/Globulin Ratio: 1.3 (ref 1.2–2.2)
Albumin: 4.4 g/dL (ref 3.6–4.8)
Alkaline Phosphatase: 61 IU/L (ref 39–117)
BUN/Creatinine Ratio: 13 (ref 10–24)
BUN: 19 mg/dL (ref 8–27)
Bilirubin Total: 0.4 mg/dL (ref 0.0–1.2)
CO2: 22 mmol/L (ref 20–29)
CREATININE: 1.45 mg/dL — AB (ref 0.76–1.27)
Calcium: 9.4 mg/dL (ref 8.6–10.2)
Chloride: 104 mmol/L (ref 96–106)
GFR calc Af Amer: 58 mL/min/{1.73_m2} — ABNORMAL LOW (ref >59)
GFR calc non Af Amer: 50 mL/min/{1.73_m2} — ABNORMAL LOW (ref >59)
Globulin, Total: 3.3 g/dL (ref 1.5–4.5)
Glucose: 97 mg/dL (ref 65–99)
POTASSIUM: 4.1 mmol/L (ref 3.5–5.2)
Sodium: 142 mmol/L (ref 134–144)
Total Protein: 7.7 g/dL (ref 6.0–8.5)

## 2017-05-27 LAB — PSA: PROSTATE SPECIFIC AG, SERUM: 0.5 ng/mL (ref 0.0–4.0)

## 2017-05-28 ENCOUNTER — Ambulatory Visit (INDEPENDENT_AMBULATORY_CARE_PROVIDER_SITE_OTHER): Payer: Medicare Other | Admitting: Podiatry

## 2017-05-28 ENCOUNTER — Encounter: Payer: Self-pay | Admitting: Podiatry

## 2017-05-28 DIAGNOSIS — M79671 Pain in right foot: Secondary | ICD-10-CM

## 2017-05-28 DIAGNOSIS — B353 Tinea pedis: Secondary | ICD-10-CM

## 2017-05-28 DIAGNOSIS — B351 Tinea unguium: Secondary | ICD-10-CM | POA: Diagnosis not present

## 2017-05-28 DIAGNOSIS — M79672 Pain in left foot: Secondary | ICD-10-CM

## 2017-05-28 DIAGNOSIS — R234 Changes in skin texture: Secondary | ICD-10-CM | POA: Diagnosis not present

## 2017-05-28 NOTE — Patient Instructions (Signed)
Seen for hypertrophic nails and cracking skin. All nails and hard cracking lesion debrided. May use antifungal cream and Vitamin A cream alternate. Return in 3 months or sooner if needed.

## 2017-05-28 NOTE — Progress Notes (Signed)
Subjective: 67 y.o. year old male patient presents complaining of cracking and fissuring skin and fungal nails. He has finised 3 month oral antifungal treatment.   Objective: Dermatologic: Thick yellow deformed nails x 10. Fissured plantar and sulcus calluses bilateral. Tinea pedis sulcus bilateral. Vascular: Pedal pulses are all palpable. Orthopedic: No gross deformities. Neurologic: All epicritic and tactile sensations grossly intact.  Assessment: Dystrophic mycotic nails x 10. Fissuring callus plantar bilateral. Tinea pedis bilateral. Pain in both feet.  Treatment: All mycotic nails and keratotic tissues debrided.  Antibiotic ointment applied. Instructed to use antifungal cream and moisturizing cream. Return in 3 months or sooner if needed.

## 2017-06-02 ENCOUNTER — Encounter: Payer: Self-pay | Admitting: *Deleted

## 2017-08-17 ENCOUNTER — Other Ambulatory Visit: Payer: Self-pay | Admitting: Family Medicine

## 2017-08-17 DIAGNOSIS — I10 Essential (primary) hypertension: Secondary | ICD-10-CM

## 2017-08-18 NOTE — Telephone Encounter (Signed)
Refill not appropriate. Last prescription was on 05/26/17 #90 with 3 refills.

## 2017-08-26 ENCOUNTER — Ambulatory Visit (INDEPENDENT_AMBULATORY_CARE_PROVIDER_SITE_OTHER): Payer: Medicare Other | Admitting: Podiatry

## 2017-08-26 DIAGNOSIS — M79671 Pain in right foot: Secondary | ICD-10-CM | POA: Diagnosis not present

## 2017-08-26 DIAGNOSIS — R234 Changes in skin texture: Secondary | ICD-10-CM

## 2017-08-26 DIAGNOSIS — B351 Tinea unguium: Secondary | ICD-10-CM

## 2017-08-26 DIAGNOSIS — M79672 Pain in left foot: Secondary | ICD-10-CM | POA: Diagnosis not present

## 2017-08-26 NOTE — Progress Notes (Signed)
Subjective: 67 y.o. year old male patient presents complaining of painful nails. Patient requests toe nails trimmed.   Objective: Dermatologic: Thick yellow deformed nails x 10. Fissured skin under the both great toes with callus build up. Vascular: Pedal pulses are all palpable. Orthopedic:  No gross deformities. Neurologic: All epicritic and tactile sensations grossly intact.  Assessment: Dystrophic mycotic nails x 10. Fissured calluses. Painful feet.  Treatment: All mycotic nails and calluses debrided.  Bleeding area cleansed and Amerigel ointment dressing applied. Return in 3 months or as needed.

## 2017-08-26 NOTE — Patient Instructions (Signed)
Seen for hypertrophic nails. All nails debrided. Return in 3 months or as needed.  

## 2017-08-27 ENCOUNTER — Encounter: Payer: Self-pay | Admitting: Podiatry

## 2017-11-15 ENCOUNTER — Other Ambulatory Visit: Payer: Self-pay | Admitting: Family Medicine

## 2017-11-15 DIAGNOSIS — N529 Male erectile dysfunction, unspecified: Secondary | ICD-10-CM

## 2017-11-17 NOTE — Telephone Encounter (Signed)
Refill?  Last seen 05/26/17

## 2017-11-19 NOTE — Telephone Encounter (Signed)
Discussed at last wellness exam.  Refilled.

## 2017-11-21 ENCOUNTER — Ambulatory Visit: Payer: Medicare Other | Admitting: Family Medicine

## 2017-11-24 ENCOUNTER — Other Ambulatory Visit: Payer: Self-pay

## 2017-11-24 ENCOUNTER — Encounter: Payer: Self-pay | Admitting: Family Medicine

## 2017-11-24 ENCOUNTER — Ambulatory Visit (INDEPENDENT_AMBULATORY_CARE_PROVIDER_SITE_OTHER): Payer: Medicare Other | Admitting: Family Medicine

## 2017-11-24 ENCOUNTER — Ambulatory Visit (HOSPITAL_COMMUNITY)
Admission: RE | Admit: 2017-11-24 | Discharge: 2017-11-24 | Disposition: A | Discharge status: Home / Self Care | Payer: Medicare Other | Source: Ambulatory Visit | Attending: Family Medicine | Admitting: Family Medicine

## 2017-11-24 DIAGNOSIS — G319 Degenerative disease of nervous system, unspecified: Secondary | ICD-10-CM | POA: Diagnosis not present

## 2017-11-24 DIAGNOSIS — I10 Essential (primary) hypertension: Secondary | ICD-10-CM

## 2017-11-24 DIAGNOSIS — R51 Headache: Secondary | ICD-10-CM | POA: Diagnosis not present

## 2017-11-24 DIAGNOSIS — R600 Localized edema: Secondary | ICD-10-CM | POA: Insufficient documentation

## 2017-11-24 DIAGNOSIS — R42 Dizziness and giddiness: Secondary | ICD-10-CM

## 2017-11-24 DIAGNOSIS — R519 Headache, unspecified: Secondary | ICD-10-CM

## 2017-11-24 DIAGNOSIS — M65312 Trigger thumb, left thumb: Secondary | ICD-10-CM

## 2017-11-24 LAB — POCT CBC
GRANULOCYTE PERCENT: 45.6 % (ref 37–80)
HCT, POC: 43.1 % — AB (ref 43.5–53.7)
HEMOGLOBIN: 13.6 g/dL — AB (ref 14.1–18.1)
Lymph, poc: 2 (ref 0.6–3.4)
MCH: 25.9 pg — AB (ref 27–31.2)
MCHC: 31.5 g/dL — AB (ref 31.8–35.4)
MCV: 82.1 fL (ref 80–97)
MID (cbc): 0.4 (ref 0–0.9)
MPV: 7 fL (ref 0–99.8)
POC GRANULOCYTE: 2 (ref 2–6.9)
POC LYMPH PERCENT: 46.2 %L (ref 10–50)
POC MID %: 8.2 % (ref 0–12)
Platelet Count, POC: 224 10*3/uL (ref 142–424)
RBC: 5.26 M/uL (ref 4.69–6.13)
RDW, POC: 13.7 %
WBC: 4.4 10*3/uL — AB (ref 4.6–10.2)

## 2017-11-24 LAB — GLUCOSE, POCT (MANUAL RESULT ENTRY): POC Glucose: 71 mg/dl (ref 70–99)

## 2017-11-24 LAB — POCT I-STAT CREATININE: CREATININE: 1.3 mg/dL — AB (ref 0.61–1.24)

## 2017-11-24 MED ORDER — MECLIZINE HCL 25 MG PO TABS: 25.0000 mg | ORAL_TABLET | Freq: Three times a day (TID) | ORAL | 0 refills | Status: DC | PRN

## 2017-11-24 MED ORDER — LISINOPRIL 10 MG PO TABS: 5.0000 mg | ORAL_TABLET | Freq: Every day | ORAL | 1 refills | Status: DC

## 2017-11-24 MED ORDER — GADOBENATE DIMEGLUMINE 529 MG/ML IV SOLN
15.0000 mL | Freq: Once | INTRAVENOUS | Status: AC | PRN
  Administered 2017-11-24: 15 mL via INTRAVENOUS

## 2017-11-24 NOTE — Progress Notes (Addendum)
Subjective:  By signing my name below, I, [name], attest that this documentation has been prepared under the direction and in the presence of [name], MD Electronically Signed: [name] Medical Scribe 11/24/2017 at 12:15 PM.   Patient ID: Darrell Wells, male    DOB: [birth year], 67 y.o.   MRN: [id number] Chief Complaint  Patient presents with  . Dizziness    since froday at 3 am. got better yeterday,  but still feels the room is spinning with bending or looking up   . painful left thumb    going on a month    HPI Darrell Wells is a 67 y.o. male who presents to Primary Care at [facility] complaining of dizzzines, onset was 3 days ago. He does have a history of hypertension, takes Proscar for BPH and Viagra as need for ED. Patient reports feeling different from last time, and being more dizzy, he does report improvement and feeling better today. He reported that when he stood up to go to the bathroom he felt dizziness. He denies any neurological symptoms, no focal weakness. He reports that in the past the dizziness would last 3 to 4 days a couple times a years. He reports that this is different and that dizziness has occurred less often. He reports that he believed he had low blood pressure and checked it, which was 112/69. Blood pressure readings have been in 110's/70-80's. He continues to exercise and do normal activities. Reports having some pressure in the head since 2 days ago, with new onset of headaches. Denies having severe pain. He describes headaches as a "squeezing sensation around head". When he lays down and turns head fast he does have dizziness. Denies drinking alcohol recently. But does report drinking a glass of wine and a corona occasionally.   Not taking lisinopril for blood pressure recently.  Had been taken off amlodipine prior, but planned on continuing lisinopril, but has only been taking up to once per week. Home readings 128/89. No specific reason for taking  med. Had taken lisinopril few hours before checking BP as above on Friday.    Patient Active Problem List   Diagnosis Date Noted  . Essential hypertension, benign 05/03/2014  . BPH (benign prostatic hyperplasia) 05/03/2014  . Erectile dysfunction 05/03/2014   Past Medical History:  Diagnosis Date  . Enlarged prostate   . Hypertension    Past Surgical History:  Procedure Laterality Date  . 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 Authorizing Provider  finasteride (PROSCAR) 5 MG tablet Take 1 tablet (5 mg total) by mouth daily. 05/26/17  Yes [name], MD  lisinopril (PRINIVIL,ZESTRIL) 20 MG tablet Take 1 tablet (20 mg total) by mouth daily. Patient not taking: Reported on 11/24/2017 05/26/17   [name], MD  sildenafil (VIAGRA) 100 MG tablet TAKE ONE-HALF (1/2) TO ONE TABLET DAILY AS NEEDED FOR ERECTILE DYSFUNCTION Patient not taking: Reported on 11/24/2017 05/26/17   [name], MD   Social History   Socioeconomic History  . Marital status: Single    Spouse name: Not on file  . Number of children: Not on file  . Years of education: Not on file  . Highest education level: Not on file  Occupational History  . Not on file  Social Needs  . Financial resource strain: Not on file  . Food insecurity:  Worry: Not on file    Inability: Not on file  . Transportation needs:    Medical: Not on file    Non-medical: Not on file  Tobacco Use  . Smoking status: Never Smoker  . Smokeless tobacco: Never Used  Substance and Sexual Activity  . Alcohol use: Yes    Comment: rare  . Drug use: No  . Sexual activity: Yes  Lifestyle  . Physical activity:    Days per week: Not on file    Minutes per session: Not on file  . Stress: Not on file  Relationships  . Social connections:    Talks on phone: Not on file    Gets together: Not on file    Attends  religious service: Not on file    Active member of club or organization: Not on file    Attends meetings of clubs or organizations: Not on file    Relationship status: Not on file  . Intimate partner violence:    Fear of current or ex partner: Not on file    Emotionally abused: Not on file    Physically abused: Not on file    Forced sexual activity: Not on file  Other Topics Concern  . Not on file  Social History Narrative  . Not on file     Review of Systems  Constitutional: Negative for fatigue, fever and unexpected weight change.  Eyes:       Denies blurred vision  Genitourinary: Negative for difficulty urinating, dysuria, frequency, hematuria and urgency.  Neurological: Positive for dizziness and headaches. Negative for weakness.       No focal weakness. Bandlike headaches.      Objective:   Physical Exam  Constitutional: He is oriented to person, place, and time. He appears well-developed and well-nourished.  HENT:  Head: Normocephalic and atraumatic.  Right Ear: Tympanic membrane and ear canal normal.  Left Ear: Tympanic membrane and ear canal normal.  Mouth/Throat: Oropharynx is clear and moist and mucous membranes are normal.  Ears were fairly grey.  Mouth: equal pallet elevation.  Eyes: Pupils are equal, round, and reactive to light. EOM are normal.  Neck: No JVD present. Carotid bruit is not present.  Cardiovascular: Normal rate, regular rhythm and normal heart sounds.  No murmur heard. Pulmonary/Chest: Effort normal and breath sounds normal. He has no rales.  Musculoskeletal: He exhibits no edema.       Arms: Some triggering of the left thumb, relaxed right thumb has a good range of motion. Slight discomfort at the MCP of the thumb.  Neurological: He is alert and oriented to person, place, and time. He displays no tremor.  No pronator drift. Slightly unsteady with romberg but steady with assistance. Minimal dizzines when standing up. No staginess noted when  seated. Possible one beat horizontal nystagmus when seating up.   Skin: Skin is warm and dry.  Psychiatric: He has a normal mood and affect.  Vitals reviewed.  Vitals:   11/24/17 1120  BP: 130/82  Pulse: 89  Temp: 98.6 F (37 C)  TempSrc: Oral  SpO2: 98%  Weight: 156 lb 6.4 oz (70.9 kg)  Height: 5\' 9"  (1.753 m)   Results for orders placed or performed in visit on 11/24/17  POCT CBC  Result Value Ref Range   WBC 4.4 (A) 4.6 - 10.2 K/uL   Lymph, poc 2.0 0.6 - 3.4   POC LYMPH PERCENT 46.2 10 - 50 %L   MID (cbc) 0.4 0 - 0.9  POC MID % 8.2 0 - 12 %M   POC Granulocyte 2.0 2 - 6.9   Granulocyte percent 45.6 37 - 80 %G   RBC 5.26 4.69 - 6.13 M/uL   Hemoglobin 13.6 (A) 14.1 - 18.1 g/dL   HCT, POC 43.1 (A) 43.5 - 53.7 %   MCV 82.1 80 - 97 fL   MCH, POC 25.9 (A) 27 - 31.2 pg   MCHC 31.5 (A) 31.8 - 35.4 g/dL   RDW, POC 13.7 %   Platelet Count, POC 224 142 - 424 K/uL   MPV 7.0 0 - 99.8 fL  POCT glucose (manual entry)  Result Value Ref Range   POC Glucose 71 70 - 99 mg/dl   Orthostatic VS for the past 24 hrs (Last 3 readings):  BP- Lying Pulse- Lying BP- Sitting Pulse- Sitting BP- Standing at 0 minutes Pulse- Standing at 0 minutes BP- Standing at 3 minutes Pulse- Standing at 3 minutes  11/24/17 1208 (!) 147/94 70 (!) 132/97 75 (!) 144/101 69 (!) 132/100 75        Assessment & Plan:    Darrell Wells is a 67 y.o. male Dizziness - Plan: POCT CBC, POCT glucose (manual entry), Orthostatic vital signs, meclizine (ANTIVERT) 25 MG tablet, Basic metabolic panel Acute nonintractable headache, unspecified headache type - Plan: MR Brain W Wo Contrast, CANCELED: MR Brain W Wo Contrast Vertigo - Plan: meclizine (ANTIVERT) 25 MG tablet Essential hypertension, benign - Plan: lisinopril (PRINIVIL,ZESTRIL) 10 MG tablet, Basic metabolic panel  - Dizziness may be peripheral vertigo as experienced prior, but reports change of typical symptoms and new onset bandlike headache, CVA risk factors of  hypertension, with high readings in office. Minimal nystagmus on exam, slight unsteadiness with Romberg, but otherwise nonfocal.   - will check MRI brain with and without to evaluate posterior circulation.  -meclizine prn, side effects discussed. Maintain hydration, avoid viagra for now.   - recheck 5 days. Sooner or ER if acute worsening.   6:54 PM MRI brain reviewed with mild atrophy, no focal findings.  Mild sinus disease.  Reviewed results with patient, reassured based on these current results, continue symptomatic care as above and RTC precautions discussed.  Tylenol if needed for headache, but RTC precautions if persistent.  Understanding expressed and all questions answered.   Trigger finger of left thumb - Plan: Thumb spica  - trial of spica splint, activity modification. Recheck in 3-4 weeks. Handout given  Meds ordered this encounter  Medications  . lisinopril (PRINIVIL,ZESTRIL) 10 MG tablet    Sig: Take 0.5 tablets (5 mg total) by mouth daily.    Dispense:  30 tablet    Refill:  1  . meclizine (ANTIVERT) 25 MG tablet    Sig: Take 1 tablet (25 mg total) by mouth 3 (three) times daily as needed for dizziness.    Dispense:  30 tablet    Refill:  0   Patient Instructions   Dizziness may be due to previous vertigo, but with new headache and different symptoms of vertigo, I will arrange for an MRI to rule out brain cause of symptoms.  For now make sure you are staying hydrated, meclizine every 8 hours as needed, and recheck with me in the next 5 days.  Because blood pressure is running a little bit high, I did restart lisinopril but much lower dose than one you took previously.  Initially start with 1/2 pill/day, then if blood pressure is remaining elevated, we can increase that further.  Vertigo Vertigo is the feeling that you or your surroundings are moving when they are not. Vertigo can be dangerous if it occurs while you are doing something that could endanger you or others,  such as driving. What are the causes? This condition is caused by a disturbance in the signals that are sent by your body's sensory systems to your brain. Different causes of a disturbance can lead to vertigo, including:  Infections, especially in the inner ear.  A bad reaction to a drug, or misuse of alcohol and medicines.  Withdrawal from drugs or alcohol.  Quickly changing positions, as when lying down or rolling over in bed.  Migraine headaches.  Decreased blood flow to the brain.  Decreased blood pressure.  Increased pressure in the brain from a head or neck injury, stroke, infection, tumor, or bleeding.  Central nervous system disorders.  What are the signs or symptoms? Symptoms of this condition usually occur when you move your head or your eyes in different directions. Symptoms may start suddenly, and they usually last for less than a minute. Symptoms may include:  Loss of balance and falling.  Feeling like you are spinning or moving.  Feeling like your surroundings are spinning or moving.  Nausea and vomiting.  Blurred vision or double vision.  Difficulty hearing.  Slurred speech.  Dizziness.  Involuntary eye movement (nystagmus).  Symptoms can be mild and cause only slight annoyance, or they can be severe and interfere with daily life. Episodes of vertigo may return (recur) over time, and they are often triggered by certain movements. Symptoms may improve over time. How is this diagnosed? This condition may be diagnosed based on medical history and the quality of your nystagmus. Your health care provider may test your eye movements by asking you to quickly change positions to trigger the nystagmus. This may be called the Dix-Hallpike test, head thrust test, or roll test. You may be referred to a health care provider who specializes in ear, nose, and throat (ENT) problems (otolaryngologist) or a provider who specializes in disorders of the central nervous system  (neurologist). You may have additional testing, including:  A physical exam.  Blood tests.  MRI.  A CT scan.  An electrocardiogram (ECG). This records electrical activity in your heart.  An electroencephalogram (EEG). This records electrical activity in your brain.  Hearing tests.  How is this treated? Treatment for this condition depends on the cause and the severity of the symptoms. Treatment options include:  Medicines to treat nausea or vertigo. These are usually used for severe cases. Some medicines that are used to treat other conditions may also reduce or eliminate vertigo symptoms. These include: ? Medicines that control allergies (antihistamines). ? Medicines that control seizures (anticonvulsants). ? Medicines that relieve depression (antidepressants). ? Medicines that relieve anxiety (sedatives).  Head movements to adjust your inner ear back to normal. If your vertigo is caused by an ear problem, your health care provider may recommend certain movements to correct the problem.  Surgery. This is rare.  Follow these instructions at home: Safety  Move slowly.Avoid sudden body or head movements.  Avoid driving.  Avoid operating heavy machinery.  Avoid doing any tasks that would cause danger to you or others if you would have a vertigo episode during the task.  If you have trouble walking or keeping your balance, try using a cane for stability. If you feel dizzy or unstable, sit down right away.  Return to your normal activities as told by  your health care provider. Ask your health care provider what activities are safe for you. General instructions  Take over-the-counter and prescription medicines only as told by your health care provider.  Avoid certain positions or movements as told by your health care provider.  Drink enough fluid to keep your urine clear or pale yellow.  Keep all follow-up visits as told by your health care provider. This is  important. Contact a health care provider if:  Your medicines do not relieve your vertigo or they make it worse.  You have a fever.  Your condition gets worse or you develop new symptoms.  Your family or friends notice any behavioral changes.  Your nausea or vomiting gets worse.  You have numbness or a "pins and needles" sensation in part of your body. Get help right away if:  You have difficulty moving or speaking.  You are always dizzy.  You faint.  You develop severe headaches.  You have weakness in your hands, arms, or legs.  You have changes in your hearing or vision.  You develop a stiff neck.  You develop sensitivity to light. This information is not intended to replace advice given to you by your health care provider. Make sure you discuss any questions you have with your health care provider. Document Released: 01/23/2005 Document Revised: 09/27/2015 Document Reviewed: 08/08/2014 Elsevier Interactive Patient Education  2018 [employer].   Trigger Finger Trigger finger (stenosing tenosynovitis) is a condition that causes a finger to get stuck in a bent position. Each finger has a tough, cord-like tissue that connects muscle to bone (tendon), and each tendon is surrounded by a tunnel of tissue (tendon sheath). To move your finger, your tendon needs to slide freely through the sheath. Trigger finger happens when the tendon or the sheath thickens, making it difficult to move your finger. Trigger finger can affect any finger or a thumb. It may affect more than one finger. Mild cases may clear up with rest and medicine. Severe cases require more treatment. What are the causes? Trigger finger is caused by a thickened finger tendon or tendon sheath. The cause of this thickening is not known. What increases the risk? The following factors may make you more likely to develop this condition:  Doing activities that require a strong grip.  Having rheumatoid arthritis,  gout, or diabetes.  Being 43-89 years old.  Being a woman.  What are the signs or symptoms? Symptoms of this condition include:  Pain when bending or straightening your finger.  Tenderness or swelling where your finger attaches to the palm of your hand.  A lump in the palm of your hand or on the inside of your finger.  Hearing a popping sound when you try to straighten your finger.  Feeling a popping, catching, or locking sensation when you try to straighten your finger.  Being unable to straighten your finger.  How is this diagnosed? This condition is diagnosed based on your symptoms and a physical exam. How is this treated? This condition may be treated by:  Resting your finger and avoiding activities that make symptoms worse.  Wearing a finger splint to keep your finger in a slightly bent position.  Taking NSAIDs to relieve pain and swelling.  Injecting medicine (steroids) into the tendon sheath to reduce swelling and irritation. Injections may need to be repeated.  Having surgery to open the tendon sheath. This may be done if other treatments do not work and you cannot straighten your finger. You may  need physical therapy after surgery.  Follow these instructions at home:  Use moist heat to help reduce pain and swelling as told by your health care provider.  Rest your finger and avoid activities that make pain worse. Return to normal activities as told by your health care provider.  If you have a splint, wear it as told by your health care provider.  Take over-the-counter and prescription medicines only as told by your health care provider.  Keep all follow-up visits as told by your health care provider. This is important. Contact a health care provider if:  Your symptoms are not improving with home care. Summary  Trigger finger (stenosing tenosynovitis) causes your finger to get stuck in a bent position, and it can make it difficult and painful to straighten  your finger.  This condition develops when a finger tendon or tendon sheath thickens.  Treatment starts with resting, wearing a splint, and taking NSAIDs.  In severe cases, surgery to open the tendon sheath may be needed. This information is not intended to replace advice given to you by your health care provider. Make sure you discuss any questions you have with your health care provider. Document Released: 02/03/2004 Document Revised: 03/26/2016 Document Reviewed: 03/26/2016 Elsevier Interactive Patient Education  2017 [employer].  IF you received an x-ray today, you will receive an invoice from [facility]. Please contact [facility] at [phone] with questions or concerns regarding your invoice.   IF you received labwork today, you will receive an invoice from [facility]. Please contact [facility] at [phone] with questions or concerns regarding your invoice.   Our billing staff will not be able to assist you with questions regarding bills from these companies.  You will be contacted with the lab results as soon as they are available. The fastest way to get your results is to activate your My Chart account. Instructions are located on the last page of this paperwork. If you have not heard from [place] regarding the results in 2 weeks, please contact this office.       I personally performed the services described in this documentation, which was scribed in my presence. The recorded information has been reviewed and considered for accuracy and completeness, addended by me as needed, and agree with information above.  Signed,   Merri Ray, MD [facility] at [place].  11/24/17 1:09 PM

## 2017-11-24 NOTE — Patient Instructions (Addendum)
Dizziness may be due to previous vertigo, but with new headache and different symptoms of vertigo, I will arrange for an MRI to rule out brain cause of symptoms.  For now make sure you are staying hydrated, meclizine every 8 hours as needed, and recheck with me in the next 5 days.  Because blood pressure is running a little bit high, I did restart lisinopril but much lower dose than one you took previously.  Initially start with 1/2 pill/day, then if blood pressure is remaining elevated, we can increase that further.  Vertigo Vertigo is the feeling that you or your surroundings are moving when they are not. Vertigo can be dangerous if it occurs while you are doing something that could endanger you or others, such as driving. What are the causes? This condition is caused by a disturbance in the signals that are sent by your body's sensory systems to your brain. Different causes of a disturbance can lead to vertigo, including:  Infections, especially in the inner ear.  A bad reaction to a drug, or misuse of alcohol and medicines.  Withdrawal from drugs or alcohol.  Quickly changing positions, as when lying down or rolling over in bed.  Migraine headaches.  Decreased blood flow to the brain.  Decreased blood pressure.  Increased pressure in the brain from a head or neck injury, stroke, infection, tumor, or bleeding.  Central nervous system disorders.  What are the signs or symptoms? Symptoms of this condition usually occur when you move your head or your eyes in different directions. Symptoms may start suddenly, and they usually last for less than a minute. Symptoms may include:  Loss of balance and falling.  Feeling like you are spinning or moving.  Feeling like your surroundings are spinning or moving.  Nausea and vomiting.  Blurred vision or double vision.  Difficulty hearing.  Slurred speech.  Dizziness.  Involuntary eye movement (nystagmus).  Symptoms can be mild and  cause only slight annoyance, or they can be severe and interfere with daily life. Episodes of vertigo may return (recur) over time, and they are often triggered by certain movements. Symptoms may improve over time. How is this diagnosed? This condition may be diagnosed based on medical history and the quality of your nystagmus. Your health care provider may test your eye movements by asking you to quickly change positions to trigger the nystagmus. This may be called the Dix-Hallpike test, head thrust test, or roll test. You may be referred to a health care provider who specializes in ear, nose, and throat (ENT) problems (otolaryngologist) or a provider who specializes in disorders of the central nervous system (neurologist). You may have additional testing, including:  A physical exam.  Blood tests.  MRI.  A CT scan.  An electrocardiogram (ECG). This records electrical activity in your heart.  An electroencephalogram (EEG). This records electrical activity in your brain.  Hearing tests.  How is this treated? Treatment for this condition depends on the cause and the severity of the symptoms. Treatment options include:  Medicines to treat nausea or vertigo. These are usually used for severe cases. Some medicines that are used to treat other conditions may also reduce or eliminate vertigo symptoms. These include: ? Medicines that control allergies (antihistamines). ? Medicines that control seizures (anticonvulsants). ? Medicines that relieve depression (antidepressants). ? Medicines that relieve anxiety (sedatives).  Head movements to adjust your inner ear back to normal. If your vertigo is caused by an ear problem, your health care provider  may recommend certain movements to correct the problem.  Surgery. This is rare.  Follow these instructions at home: Safety  Move slowly.Avoid sudden body or head movements.  Avoid driving.  Avoid operating heavy machinery.  Avoid doing any  tasks that would cause danger to you or others if you would have a vertigo episode during the task.  If you have trouble walking or keeping your balance, try using a cane for stability. If you feel dizzy or unstable, sit down right away.  Return to your normal activities as told by your health care provider. Ask your health care provider what activities are safe for you. General instructions  Take over-the-counter and prescription medicines only as told by your health care provider.  Avoid certain positions or movements as told by your health care provider.  Drink enough fluid to keep your urine clear or pale yellow.  Keep all follow-up visits as told by your health care provider. This is important. Contact a health care provider if:  Your medicines do not relieve your vertigo or they make it worse.  You have a fever.  Your condition gets worse or you develop new symptoms.  Your family or friends notice any behavioral changes.  Your nausea or vomiting gets worse.  You have numbness or a "pins and needles" sensation in part of your body. Get help right away if:  You have difficulty moving or speaking.  You are always dizzy.  You faint.  You develop severe headaches.  You have weakness in your hands, arms, or legs.  You have changes in your hearing or vision.  You develop a stiff neck.  You develop sensitivity to light. This information is not intended to replace advice given to you by your health care provider. Make sure you discuss any questions you have with your health care provider. Document Released: 01/23/2005 Document Revised: 09/27/2015 Document Reviewed: 08/08/2014 Elsevier Interactive Patient Education  2018 [place].   Trigger Finger Trigger finger (stenosing tenosynovitis) is a condition that causes a finger to get stuck in a bent position. Each finger has a tough, cord-like tissue that connects muscle to bone (tendon), and each tendon is surrounded  by a tunnel of tissue (tendon sheath). To move your finger, your tendon needs to slide freely through the sheath. Trigger finger happens when the tendon or the sheath thickens, making it difficult to move your finger. Trigger finger can affect any finger or a thumb. It may affect more than one finger. Mild cases may clear up with rest and medicine. Severe cases require more treatment. What are the causes? Trigger finger is caused by a thickened finger tendon or tendon sheath. The cause of this thickening is not known. What increases the risk? The following factors may make you more likely to develop this condition:  Doing activities that require a strong grip.  Having rheumatoid arthritis, gout, or diabetes.  Being 51-71 years old.  Being a woman.  What are the signs or symptoms? Symptoms of this condition include:  Pain when bending or straightening your finger.  Tenderness or swelling where your finger attaches to the palm of your hand.  A lump in the palm of your hand or on the inside of your finger.  Hearing a popping sound when you try to straighten your finger.  Feeling a popping, catching, or locking sensation when you try to straighten your finger.  Being unable to straighten your finger.  How is this diagnosed? This condition is diagnosed based on  your symptoms and a physical exam. How is this treated? This condition may be treated by:  Resting your finger and avoiding activities that make symptoms worse.  Wearing a finger splint to keep your finger in a slightly bent position.  Taking NSAIDs to relieve pain and swelling.  Injecting medicine (steroids) into the tendon sheath to reduce swelling and irritation. Injections may need to be repeated.  Having surgery to open the tendon sheath. This may be done if other treatments do not work and you cannot straighten your finger. You may need physical therapy after surgery.  Follow these instructions at home:  Use  moist heat to help reduce pain and swelling as told by your health care provider.  Rest your finger and avoid activities that make pain worse. Return to normal activities as told by your health care provider.  If you have a splint, wear it as told by your health care provider.  Take over-the-counter and prescription medicines only as told by your health care provider.  Keep all follow-up visits as told by your health care provider. This is important. Contact a health care provider if:  Your symptoms are not improving with home care. Summary  Trigger finger (stenosing tenosynovitis) causes your finger to get stuck in a bent position, and it can make it difficult and painful to straighten your finger.  This condition develops when a finger tendon or tendon sheath thickens.  Treatment starts with resting, wearing a splint, and taking NSAIDs.  In severe cases, surgery to open the tendon sheath may be needed. This information is not intended to replace advice given to you by your health care provider. Make sure you discuss any questions you have with your health care provider. Document Released: 02/03/2004 Document Revised: 03/26/2016 Document Reviewed: 03/26/2016 Elsevier Interactive Patient Education  2017 [employer].  IF you received an x-ray today, you will receive an invoice from [facility]. Please contact [facility] at [phone] with questions or concerns regarding your invoice.   IF you received labwork today, you will receive an invoice from [place]. Please contact [facility] at [phone] with questions or concerns regarding your invoice.   Our billing staff will not be able to assist you with questions regarding bills from these companies.  You will be contacted with the lab results as soon as they are available. The fastest way to get your results is to activate your My Chart account. Instructions are located on the last page of this paperwork. If you  have not heard from [place] regarding the results in 2 weeks, please contact this office.

## 2017-11-25 LAB — BASIC METABOLIC PANEL
BUN/Creatinine Ratio: 11 (ref 10–24)
BUN: 14 mg/dL (ref 8–27)
CHLORIDE: 101 mmol/L (ref 96–106)
CO2: 21 mmol/L (ref 20–29)
Calcium: 9.8 mg/dL (ref 8.6–10.2)
Creatinine, Ser: 1.3 mg/dL — ABNORMAL HIGH (ref 0.76–1.27)
GFR calc Af Amer: 65 mL/min/{1.73_m2} (ref >59)
GFR calc non Af Amer: 56 mL/min/{1.73_m2} — ABNORMAL LOW (ref >59)
GLUCOSE: 71 mg/dL (ref 65–99)
POTASSIUM: 4.4 mmol/L (ref 3.5–5.2)
Sodium: 140 mmol/L (ref 134–144)

## 2017-11-26 ENCOUNTER — Ambulatory Visit: Payer: Medicare Other | Admitting: Podiatry

## 2017-11-29 ENCOUNTER — Ambulatory Visit (INDEPENDENT_AMBULATORY_CARE_PROVIDER_SITE_OTHER): Payer: Medicare Other | Admitting: Family Medicine

## 2017-11-29 ENCOUNTER — Other Ambulatory Visit: Payer: Self-pay

## 2017-11-29 ENCOUNTER — Encounter: Payer: Self-pay | Admitting: Family Medicine

## 2017-11-29 VITALS — BP 108/72 | HR 74 | Temp 98.4°F | Ht 69.0 in | Wt 158.8 lb

## 2017-11-29 DIAGNOSIS — R42 Dizziness and giddiness: Secondary | ICD-10-CM | POA: Diagnosis not present

## 2017-11-29 DIAGNOSIS — I10 Essential (primary) hypertension: Secondary | ICD-10-CM | POA: Diagnosis not present

## 2017-11-29 NOTE — Progress Notes (Signed)
Subjective:    Patient ID: Darrell Wells, male    DOB: 03/15/1951, 67 y.o.   MRN: [id number]  HPI Darrell Wells is a 67 y.o. male Presents today for: Chief Complaint  Patient presents with  . Dizziness    5 day follow up. still feels hazy    Dizziness: Seen July 29th.  Onset of dizziness 3 days prior stood up to the bathroom and felt dizzy, no focal weakness, history of vertigo.  Similar except did have some headache and was not improving in time pattern that had done in the past.  Did note dizziness with fast movement of head or laying backwards.  Blood pressure mildly elevated last visit, glucose normal, hemoglobin 13.6. He was prescribed meclizine, restarted lisinopril for blood pressure.  MRI brain was obtained to evaluate posterior circulation, without signs of acute CVA or acute findings.  We discussed continued symptomatic care with Tylenol, meclizine as needed.  Today, feels better but still some dizziness with head movement left and right or with bending down and up or looking up.  Taking meclizine tid. No new side effects, but some drowsiness. No focal weakness.  Headache has pretty much resolved. Only noted first few days.   Hypertension: BP Readings from Last 3 Encounters:  11/29/17 108/72  11/24/17 130/82  05/26/17 132/80   Lab Results  Component Value Date   CREATININE 1.30 (H) 11/24/2017  Taking lisinopril 5mg  qd (half the 10mg ).  No new lightheadedness. Blood pressures in the past have been okay off medications.      Patient Active Problem List   Diagnosis Date Noted  . Essential hypertension, benign 05/03/2014  . BPH (benign prostatic hyperplasia) 05/03/2014  . Erectile dysfunction 05/03/2014   Past Medical History:  Diagnosis Date  . Enlarged prostate   . Hypertension    Past Surgical History:  Procedure Laterality Date  . 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 Authorizing Provider  finasteride (PROSCAR) 5 MG tablet Take 1 tablet (5 mg total) by mouth daily. 05/26/17  Yes [name], MD  lisinopril (PRINIVIL,ZESTRIL) 10 MG tablet Take 0.5 tablets (5 mg total) by mouth daily. 11/24/17  Yes [name], MD  meclizine (ANTIVERT) 25 MG tablet Take 1 tablet (25 mg total) by mouth 3 (three) times daily as needed for dizziness. 11/24/17  Yes [name], MD  sildenafil (VIAGRA) 100 MG tablet TAKE ONE-HALF (1/2) TO ONE TABLET DAILY AS NEEDED FOR ERECTILE DYSFUNCTION 05/26/17  Yes [name], MD   Social History   Socioeconomic History  . Marital status: Single    Spouse name: Not on file  . Number of children: Not on file  . Years of education: Not on file  . Highest education level: Not on file  Occupational History  . Not on file  Social Needs  . Financial resource strain: Not on file  . Food insecurity:    Worry: Not on file    Inability: Not on file  . Transportation needs:    Medical: Not on file    Non-medical: Not on file  Tobacco Use  . Smoking status: Never Smoker  . Smokeless tobacco: Never Used  Substance and Sexual Activity  . Alcohol use: Yes    Comment: rare  . Drug use: No  . Sexual activity: Yes  Lifestyle  . Physical activity:    Days  per week: Not on file    Minutes per session: Not on file  . Stress: Not on file  Relationships  . Social connections:    Talks on phone: Not on file    Gets together: Not on file    Attends religious service: Not on file    Active member of club or organization: Not on file    Attends meetings of clubs or organizations: Not on file    Relationship status: Not on file  . Intimate partner violence:    Fear of current or ex partner: Not on file    Emotionally abused: Not on file    Physically abused: Not on file    Forced sexual activity: Not on file  Other Topics Concern  . Not on file  Social History Narrative  . Not on file    Review  of Systems  Constitutional: Negative for fatigue and unexpected weight change.  Eyes: Negative for visual disturbance.  Respiratory: Negative for cough, chest tightness and shortness of breath.   Cardiovascular: Negative for chest pain, palpitations and leg swelling.  Gastrointestinal: Negative for abdominal pain and blood in stool.  Neurological: Negative for dizziness (Improving), light-headedness and headaches (resolved. ).       Objective:   Physical Exam  Constitutional: He is oriented to person, place, and time. He appears well-developed and well-nourished.  HENT:  Head: Normocephalic and atraumatic.  Eyes: Pupils are equal, round, and reactive to light. EOM are normal. Right eye exhibits normal extraocular motion and no nystagmus. Left eye exhibits normal extraocular motion and no nystagmus.  Neck: No JVD present. Carotid bruit is not present.  Cardiovascular: Normal rate, regular rhythm and normal heart sounds.  No murmur heard. Pulmonary/Chest: Effort normal and breath sounds normal. He has no rales.  Musculoskeletal: He exhibits no edema.  Neurological: He is alert and oriented to person, place, and time.  Negative romberg, no pronator drift.   Skin: Skin is warm and dry.  Psychiatric: He has a normal mood and affect.  Vitals reviewed.  Vitals:   11/29/17 1357  BP: 108/72  Pulse: 74  Temp: 98.4 F (36.9 C)  TempSrc: Oral  SpO2: 98%  Weight: 158 lb 12.8 oz (72 kg)  Height: 5\' 9"  (1.753 m)      Assessment & Plan:  Darrell Wells is a 67 y.o. male Essential hypertension  Vertigo  Improving vertigo. Reassuring MRI.  Continue meclizine, but taper as symptoms improve. rtc precautions if worsening. Consider ENT eval if persistent.   Borderline BP. Option of trial off lisinopril with home monitoring, then restart if levels increase.   . No orders of the defined types were placed in this encounter.  Patient Instructions    I am glad to hear the dizziness has  improved.  Okay to continue meclizine through this weekend, then can decrease to as needed only.  If the dizziness is not continuing to improve, would recommend follow-up or can have you evaluated by ear nose and throat specialist.  Blood pressure is better today, but may be on the lower side of normal.  Okay to continue the half dose of lisinopril, but if you want to try to come off that medication and monitor blood pressure readings, that is fine as well.  If your blood pressures go above 140/90, restart lisinopril.  Please follow-up with me in 3 months, sooner if needed.  Thank you for coming in today.   IF you received an x-ray today, you will  receive an Pharmacologist from [facility]. Please contact [facility] at [phone] with questions or concerns regarding your invoice.   IF you received labwork today, you will receive an invoice from [place]. Please contact [facility] at [phone] with questions or concerns regarding your invoice.   Our billing staff will not be able to assist you with questions regarding bills from these companies.  You will be contacted with the lab results as soon as they are available. The fastest way to get your results is to activate your My Chart account. Instructions are located on the last page of this paperwork. If you have not heard from [place] regarding the results in 2 weeks, please contact this office.       Signed,   Merri Ray, MD [facility] [place].  11/29/17 2:42 PM

## 2017-11-29 NOTE — Patient Instructions (Addendum)
  I am glad to hear the dizziness has improved.  Okay to continue meclizine through this weekend, then can decrease to as needed only.  If the dizziness is not continuing to improve, would recommend follow-up or can have you evaluated by ear nose and throat specialist.  Blood pressure is better today, but may be on the lower side of normal.  Okay to continue the half dose of lisinopril, but if you want to try to come off that medication and monitor blood pressure readings, that is fine as well.  If your blood pressures go above 140/90, restart lisinopril.  Please follow-up with me in 3 months, sooner if needed.  Thank you for coming in today.   IF you received an x-ray today, you will receive an invoice from [facility]. Please contact [facility] at [phone] with questions or concerns regarding your invoice.   IF you received labwork today, you will receive an invoice from [place]. Please contact [facility] at [phone] with questions or concerns regarding your invoice.   Our billing staff will not be able to assist you with questions regarding bills from these companies.  You will be contacted with the lab results as soon as they are available. The fastest way to get your results is to activate your My Chart account. Instructions are located on the last page of this paperwork. If you have not heard from [place] regarding the results in 2 weeks, please contact this office.

## 2017-11-30 ENCOUNTER — Encounter: Payer: Self-pay | Admitting: Family Medicine

## 2018-01-30 ENCOUNTER — Telehealth: Payer: Self-pay | Admitting: Family Medicine

## 2018-01-30 DIAGNOSIS — N529 Male erectile dysfunction, unspecified: Secondary | ICD-10-CM

## 2018-01-30 NOTE — Telephone Encounter (Signed)
Copied from [place] [id number]. Topic: General - Other >> Jan 29, 2018  1:23 PM Leward Quan A wrote: Reason for CRM: Patient called to request for Dr [name] to send a refill for a few  pills of sildenafil (VIAGRA) 100 MG tablet to [facility], [place] [address] [phone] (Phone) [phone] (Fax)  He stated that he di not need the whole dose just a few.   rx provided. Discussed at [facility] within past year.

## 2018-01-31 MED ORDER — SILDENAFIL CITRATE 100 MG PO TABS
ORAL_TABLET | ORAL | 1 refills | Status: DC
Start: 2018-01-31 — End: 2018-07-10

## 2018-02-27 ENCOUNTER — Other Ambulatory Visit: Payer: Self-pay | Admitting: Family Medicine

## 2018-02-27 DIAGNOSIS — R351 Nocturia: Principal | ICD-10-CM

## 2018-02-27 DIAGNOSIS — N401 Enlarged prostate with lower urinary tract symptoms: Secondary | ICD-10-CM

## 2018-02-27 NOTE — Telephone Encounter (Signed)
Requested medication (s) are due for refill today: Yes  Requested medication (s) are on the active medication list: Yes  Last refill:  05/26/17  Future visit scheduled: Yes  Notes to clinic:  Switching to [facility], will need a new Rx sent     Requested Prescriptions  Pending Prescriptions Disp Refills   finasteride (PROSCAR) 5 MG tablet [Pharmacy Med Name: FINASTERIDE 5MG  TABS 5MG ] 30 tablet 12    Sig: TAKE 1 TABLET DAILY (OFFICE VISIT NEEDED FOR 30 DAY SUPPLY)     Urology: 5-alpha Reductase Inhibitors Passed - 02/27/2018  4:10 PM      Passed - Valid encounter within last 12 months    Recent Outpatient Visits          3 months ago Essential hypertension   [facility] [place], MD   3 months ago Dizziness   [facility] [place], Ranell Patrick, MD   9 months ago Medicare annual wellness visit, initial   [facility] [place], Ranell Patrick, MD   1 year ago New onset headache   [facility], Arlie Solomons, MD   1 year ago Annual physical exam   [facility] [place], Ranell Patrick, MD      Future Appointments            In 3 days [name], MD [facility] [place], [facility]

## 2018-03-02 ENCOUNTER — Encounter: Payer: Self-pay | Admitting: Family Medicine

## 2018-03-02 ENCOUNTER — Other Ambulatory Visit: Payer: Self-pay

## 2018-03-02 ENCOUNTER — Ambulatory Visit (INDEPENDENT_AMBULATORY_CARE_PROVIDER_SITE_OTHER): Payer: Medicare Other | Admitting: Family Medicine

## 2018-03-02 VITALS — BP 119/77 | HR 71 | Temp 98.1°F | Ht 69.0 in | Wt 158.6 lb

## 2018-03-02 DIAGNOSIS — Z87898 Personal history of other specified conditions: Secondary | ICD-10-CM | POA: Diagnosis not present

## 2018-03-02 DIAGNOSIS — I10 Essential (primary) hypertension: Secondary | ICD-10-CM

## 2018-03-02 MED ORDER — LISINOPRIL 10 MG PO TABS: 5.0000 mg | ORAL_TABLET | Freq: Every day | ORAL | 1 refills | Status: DC

## 2018-03-02 NOTE — Progress Notes (Signed)
Subjective:  By signing my name below, I, Darrell Wells, attest that this documentation has been prepared under the direction and in the presence of Darrell Agreste, MD Electronically Signed: Ladene Wells, ED Scribe 03/02/2018 at 11:04 AM.   Patient ID: Darrell Wells, male    DOB: 1951-04-17, 67 y.o.   MRN: [id number]  Chief Complaint  Patient presents with  . Blood Pressure Check    3 m f/u    HPI Darrell Wells is a 67 y.o. male who presents to Primary Care at [facility] for f/u.  HTN Lab Results  Component Value Date   CREATININE 1.30 (H) 11/24/2017   BP Readings from Last 3 Encounters:  03/02/18 119/77  11/29/17 108/72  11/24/17 130/82  When seen 8/3, he had f/u on dizziness from July, treated with meclizine. Restarted lisinopril for borderline elevated BP. MRI obtained without signs of CVA. Still having dizziness with head movement, continued meclizine. HA resolved. Taking 5 mg lisinopril without new lightheadedness. BP borderline with option of stopping lisinopril.  Pt has continued 5 mg lisinopril with home BP readings 125/77-80. Still reports occasional lightheadedness/dizziness with standing quickly from a stooped position but has noticed symptoms improve with drinking more water. He is urinating 5-6 times/day, twice nightly. He has stopped meclizine. Denies HAs, cp, sob, leg swelling.  Patient Active Problem List   Diagnosis Date Noted  . Essential hypertension, benign 05/03/2014  . BPH (benign prostatic hyperplasia) 05/03/2014  . Erectile dysfunction 05/03/2014   Past Medical History:  Diagnosis Date  . Enlarged prostate   . Hypertension    Past Surgical History:  Procedure Laterality Date  . 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 Authorizing Provider  finasteride (PROSCAR) 5 MG tablet Take 1 tablet (5 mg total) by mouth daily. 05/26/17   Darrell Agreste, MD  lisinopril (PRINIVIL,ZESTRIL) 10 MG tablet Take 0.5 tablets (5 mg total) by mouth daily. 11/24/17   Darrell Agreste, MD  meclizine (ANTIVERT) 25 MG tablet Take 1 tablet (25 mg total) by mouth 3 (three) times daily as needed for dizziness. 11/24/17   Darrell Agreste, MD  sildenafil (VIAGRA) 100 MG tablet TAKE ONE-HALF (1/2) TO ONE TABLET DAILY AS NEEDED FOR ERECTILE DYSFUNCTION 01/31/18   Darrell Agreste, MD   Social History   Socioeconomic History  . Marital status: Single    Spouse name: Not on file  . Number of children: Not on file  . Years of education: Not on file  . Highest education level: Not on file  Occupational History  . Not on file  Social Needs  . Financial resource strain: Not on file  . Food insecurity:    Worry: Not on file    Inability: Not on file  . Transportation needs:    Medical: Not on file    Non-medical: Not on file  Tobacco Use  . Smoking status: Never Smoker  . Smokeless tobacco: Never Used  Substance and Sexual Activity  . Alcohol use: Yes    Comment: rare  . Drug use: No  . Sexual activity: Yes  Lifestyle  . Physical activity:    Days per week: Not on file    Minutes per session: Not on file  . Stress: Not on file  Relationships  . Social connections:    Talks on phone: Not on file    Gets together:  Not on file    Attends religious service: Not on file    Active member of club or organization: Not on file    Attends meetings of clubs or organizations: Not on file    Relationship status: Not on file  . Intimate partner violence:    Fear of current or ex partner: Not on file    Emotionally abused: Not on file    Physically abused: Not on file    Forced sexual activity: Not on file  Other Topics Concern  . Not on file  Social History Narrative  . Not on file   Review of Systems  Constitutional: Negative for fatigue and unexpected weight change.  Eyes: Negative for visual disturbance.  Respiratory: Negative for cough, chest  tightness and shortness of breath.   Cardiovascular: Negative for chest pain, palpitations and leg swelling.  Gastrointestinal: Negative for abdominal pain and blood in stool.  Neurological: Positive for dizziness (occasional) and light-headedness (occasional). Negative for headaches.      Objective:   Physical Exam  Constitutional: He is oriented to person, place, and time. He appears well-developed and well-nourished.  HENT:  Head: Normocephalic and atraumatic.  Eyes: Pupils are equal, round, and reactive to light. EOM are normal. Right eye exhibits no nystagmus. Left eye exhibits no nystagmus.  Neck: No JVD present. Carotid bruit is not present.  Cardiovascular: Normal rate, regular rhythm and normal heart sounds.  No murmur heard. Pulmonary/Chest: Effort normal and breath sounds normal. He has no rales.  Musculoskeletal: He exhibits no edema.  Neurological: He is alert and oriented to person, place, and time.  Skin: Skin is warm and dry.  Psychiatric: He has a normal mood and affect.  Vitals reviewed.  Vitals:   03/02/18 1045  BP: 119/77  Pulse: 71  Temp: 98.1 F (36.7 C)  TempSrc: Oral  SpO2: 99%  Weight: 158 lb 9.6 oz (71.9 kg)  Height: 5\' 9"  (1.753 m)      Assessment & Plan:  Darrell Wells is a 67 y.o. male Essential hypertension - Plan: Basic metabolic panel  History of dizziness - Plan: Basic metabolic panel  Stable control blood pressure.  Dizziness has overall improved, still some reports of mild orthostatic type symptoms.  Appears to be sufficiently hydrating.    - Recommended continued hydration, repeat BMP as borderline creatinine previously, option of cardiology eval for orthostatic hypotension if persistent symptoms.  RTC precautions if worse.   - continue lisinopril same dose for now - 5mg  qd.    No orders of the defined types were placed in this encounter.  Patient Instructions     Make sure to drink plenty of fluid during the day, stand up slowly  if leaning over.  No change in meds at this time. I will repeat kidney function test and electrolytes today.   If you continue to experience episodic lightheadedness, next step would be meeting with cardiology for possible other testing including tilt table testing.  Let me know and I am happy to place that referral.  Thanks for coming in today.    If you have lab work done today you will be contacted with your lab results within the next 2 weeks.  If you have not heard from [place] then please contact us. The fastest way to get your results is to register for My Chart.   IF you received an x-ray today, you will receive an invoice from [facility]. Please contact [facility] at [phone] with questions or  concerns regarding your invoice.   IF you received labwork today, you will receive an invoice from [place]. Please contact [facility] at [phone] with questions or concerns regarding your invoice.   Our billing staff will not be able to assist you with questions regarding bills from these companies.  You will be contacted with the lab results as soon as they are available. The fastest way to get your results is to activate your My Chart account. Instructions are located on the last page of this paperwork. If you have not heard from [place] regarding the results in 2 weeks, please contact this office.       I personally performed the services described in this documentation, which was scribed in my presence. The recorded information has been reviewed and considered for accuracy and completeness, addended by me as needed, and agree with information above.  Signed,   Merri Ray, MD Primary Care at [place].  03/02/18 11:19 AM

## 2018-03-02 NOTE — Patient Instructions (Addendum)
   Make sure to drink plenty of fluid during the day, stand up slowly if leaning over.  No change in meds at this time. I will repeat kidney function test and electrolytes today.   If you continue to experience episodic lightheadedness, next step would be meeting with cardiology for possible other testing including tilt table testing.  Let me know and I am happy to place that referral.  Thanks for coming in today.    If you have lab work done today you will be contacted with your lab results within the next 2 weeks.  If you have not heard from [place] then please contact us. The fastest way to get your results is to register for My Chart.   IF you received an x-ray today, you will receive an invoice from [facility]. Please contact [facility]iology at [phone] with questions or concerns regarding your invoice.   IF you received labwork today, you will receive an invoice from [place]. Please contact [facility] at [phone] with questions or concerns regarding your invoice.   Our billing staff will not be able to assist you with questions regarding bills from these companies.  You will be contacted with the lab results as soon as they are available. The fastest way to get your results is to activate your My Chart account. Instructions are located on the last page of this paperwork. If you have not heard from [place] regarding the results in 2 weeks, please contact this office.

## 2018-03-03 LAB — BASIC METABOLIC PANEL
BUN / CREAT RATIO: 15 (ref 10–24)
BUN: 20 mg/dL (ref 8–27)
CHLORIDE: 105 mmol/L (ref 96–106)
CO2: 20 mmol/L (ref 20–29)
Calcium: 9.3 mg/dL (ref 8.6–10.2)
Creatinine, Ser: 1.36 mg/dL — ABNORMAL HIGH (ref 0.76–1.27)
GFR calc Af Amer: 62 mL/min/{1.73_m2} (ref >59)
GFR calc non Af Amer: 53 mL/min/{1.73_m2} — ABNORMAL LOW (ref >59)
GLUCOSE: 95 mg/dL (ref 65–99)
POTASSIUM: 4.1 mmol/L (ref 3.5–5.2)
SODIUM: 141 mmol/L (ref 134–144)

## 2018-03-08 NOTE — Telephone Encounter (Signed)
Refill req for Proscar Last refill 04/2017 Last ov note - no mention of Proscar Message sent to Dr. [name]

## 2018-03-09 ENCOUNTER — Encounter: Payer: Self-pay | Admitting: Family Medicine

## 2018-05-03 ENCOUNTER — Encounter: Payer: Self-pay | Admitting: Gastroenterology

## 2018-05-08 ENCOUNTER — Encounter: Payer: Self-pay | Admitting: Gastroenterology

## 2018-05-26 ENCOUNTER — Ambulatory Visit (AMBULATORY_SURGERY_CENTER): Payer: Self-pay

## 2018-05-26 VITALS — Ht 69.0 in | Wt 161.2 lb

## 2018-05-26 DIAGNOSIS — Z86010 Personal history of colonic polyps: Secondary | ICD-10-CM

## 2018-05-26 MED ORDER — PEG 3350-KCL-NA BICARB-NACL 420 G PO SOLR: 4000.0000 mL | Freq: Once | ORAL | 0 refills | Status: AC

## 2018-05-26 NOTE — Progress Notes (Signed)
Denies allergies to eggs or soy products. Denies complication of anesthesia or sedation. Denies use of weight loss medication. Denies use of O2.   Emmi instructions declined.  

## 2018-06-09 ENCOUNTER — Encounter: Payer: Self-pay | Admitting: Gastroenterology

## 2018-06-09 ENCOUNTER — Ambulatory Visit (AMBULATORY_SURGERY_CENTER): Payer: Medicare Other | Admitting: Gastroenterology

## 2018-06-09 VITALS — BP 111/74 | HR 69 | Temp 96.0°F | Resp 11 | Ht 69.0 in | Wt 161.0 lb

## 2018-06-09 DIAGNOSIS — Z86010 Personal history of colonic polyps: Secondary | ICD-10-CM

## 2018-06-09 MED ORDER — SODIUM CHLORIDE 0.9 % IV SOLN: 500.0000 mL | Freq: Once | INTRAVENOUS | Status: DC

## 2018-06-09 NOTE — Progress Notes (Signed)
PT taken to PACU. Monitors in place. VSS. Report given to RN. 

## 2018-06-09 NOTE — Op Note (Signed)
[facility] Patient Name: Darrell Wells Procedure Date: 06/09/2018 8:35 AM MRN: [id number] Endoscopist: Mallie Mussel L. Loletha Carrow , MD Age: 68 Referring MD:  Date of Birth: 1951-03-16 Gender: Male Account #: [id number] Procedure:                Colonoscopy Indications:              Surveillance: Personal history of adenomatous                            polyps on last colonoscopy 5 years ago (diminutive                            cecal TA 02/2013) Medicines:                Monitored Anesthesia Care Procedure:                Pre-Anesthesia Assessment:                           - Prior to the procedure, a History and Physical                            was performed, and patient medications and                            allergies were reviewed. The patient's tolerance of                            previous anesthesia was also reviewed. The risks                            and benefits of the procedure and the sedation                            options and risks were discussed with the patient.                            All questions were answered, and informed consent                            was obtained. Prior Anticoagulants: The patient has                            taken no previous anticoagulant or antiplatelet                            agents. ASA Grade Assessment: II - A patient with                            mild systemic disease. After reviewing the risks                            and benefits, the patient was deemed in  satisfactory condition to undergo the procedure.                           After obtaining informed consent, the colonoscope                            was passed under direct vision. Throughout the                            procedure, the patient's blood pressure, pulse, and                            oxygen saturations were monitored continuously. The                            Colonoscope was introduced through the anus and                             advanced to the the cecum, identified by                            appendiceal orifice and ileocecal valve. The                            colonoscopy was performed without difficulty. The                            patient tolerated the procedure well. The quality                            of the bowel preparation was excellent. The                            ileocecal valve, appendiceal orifice, and rectum                            were photographed. Scope In: 8:50:22 AM Scope Out: 9:04:47 AM Scope Withdrawal Time: 0 hours 8 minutes 7 seconds  Total Procedure Duration: 0 hours 14 minutes 25 seconds  Findings:                 The perianal and digital rectal examinations were                            normal.                           A few diverticula were found in the left colon and                            right colon.                           The exam was otherwise without abnormality on  direct and retroflexion views. Complications:            No immediate complications. Estimated Blood Loss:     Estimated blood loss: none. Impression:               - Diverticulosis in the left colon and in the right                            colon.                           - The examination was otherwise normal on direct                            and retroflexion views.                           - No specimens collected. Recommendation:           - Patient has a contact number available for                            emergencies. The signs and symptoms of potential                            delayed complications were discussed with the                            patient. Return to normal activities tomorrow.                            Written discharge instructions were provided to the                            patient.                           - Resume previous diet.                           - Continue present medications.                            - Based on current guidelines, no repeat screening                            colonoscopy due to current age (77 years or older)                            and the absence of colonic polyps. Henry L. Loletha Carrow, MD 06/09/2018 9:10:18 AM This report has been signed electronically.

## 2018-06-09 NOTE — Progress Notes (Signed)
Pt's states no medical or surgical changes since previsit or office visit. 

## 2018-06-09 NOTE — Patient Instructions (Signed)
Handout on diverticulosis given  YOU HAD AN ENDOSCOPIC PROCEDURE TODAY AT [facility]:   Refer to the procedure report that was given to you for any specific questions about what was found during the examination.  If the procedure report does not answer your questions, please call your gastroenterologist to clarify.  If you requested that your care partner not be given the details of your procedure findings, then the procedure report has been included in a sealed envelope for you to review at your convenience later.  YOU SHOULD EXPECT: Some feelings of bloating in the abdomen. Passage of more gas than usual.  Walking can help get rid of the air that was put into your GI tract during the procedure and reduce the bloating. If you had a lower endoscopy (such as a colonoscopy or flexible sigmoidoscopy) you may notice spotting of blood in your stool or on the toilet paper. If you underwent a bowel prep for your procedure, you may not have a normal bowel movement for a few days.  Please Note:  You might notice some irritation and congestion in your nose or some drainage.  This is from the oxygen used during your procedure.  There is no need for concern and it should clear up in a day or so.  SYMPTOMS TO REPORT IMMEDIATELY:   Following lower endoscopy (colonoscopy or flexible sigmoidoscopy):  Excessive amounts of blood in the stool  Significant tenderness or worsening of abdominal pains  Swelling of the abdomen that is new, acute  Fever of 100F or higher   For urgent or emergent issues, a gastroenterologist can be reached at any hour by calling [phone].   DIET:  We do recommend a small meal at first, but then you may proceed to your regular diet.  Drink plenty of fluids but you should avoid alcoholic beverages for 24 hours.  ACTIVITY:  You should plan to take it easy for the rest of today and you should NOT DRIVE or use heavy machinery until tomorrow (because of the sedation  medicines used during the test).    FOLLOW UP: Our staff will call the number listed on your records the next business day following your procedure to check on you and address any questions or concerns that you may have regarding the information given to you following your procedure. If we do not reach you, we will leave a message.  However, if you are feeling well and you are not experiencing any problems, there is no need to return our call.  We will assume that you have returned to your regular daily activities without incident.  If any biopsies were taken you will be contacted by phone or by letter within the next 1-3 weeks.  Please call us at [phone] if you have not heard about the biopsies in 3 weeks.    SIGNATURES/CONFIDENTIALITY: You and/or your care partner have signed paperwork which will be entered into your electronic medical record.  These signatures attest to the fact that that the information above on your After Visit Summary has been reviewed and is understood.  Full responsibility of the confidentiality of this discharge information lies with you and/or your care-partner.

## 2018-06-10 ENCOUNTER — Telehealth: Payer: Self-pay

## 2018-06-10 NOTE — Telephone Encounter (Signed)
  Follow up Call-  Call back number 06/09/2018  Post procedure Call Back phone  # [phone]  Permission to leave phone message Yes  Some recent data might be hidden     Patient questions:  Do you have a fever, pain , or abdominal swelling? No. Pain Score  0 *  Have you tolerated food without any problems? Yes.    Have you been able to return to your normal activities? Yes.    Do you have any questions about your discharge instructions: Diet   No. Medications  No. Follow up visit  No.  Do you have questions or concerns about your Care? No.  Actions: * If pain score is 4 or above: No action needed, pain <4.

## 2018-07-10 ENCOUNTER — Ambulatory Visit (INDEPENDENT_AMBULATORY_CARE_PROVIDER_SITE_OTHER): Payer: Medicare Other | Admitting: Family Medicine

## 2018-07-10 ENCOUNTER — Encounter: Payer: Self-pay | Admitting: Family Medicine

## 2018-07-10 ENCOUNTER — Other Ambulatory Visit: Payer: Self-pay

## 2018-07-10 VITALS — BP 132/88 | HR 67 | Temp 97.8°F | Resp 16 | Ht 69.0 in | Wt 160.4 lb

## 2018-07-10 DIAGNOSIS — R3912 Poor urinary stream: Secondary | ICD-10-CM

## 2018-07-10 DIAGNOSIS — Z125 Encounter for screening for malignant neoplasm of prostate: Secondary | ICD-10-CM | POA: Diagnosis not present

## 2018-07-10 DIAGNOSIS — N401 Enlarged prostate with lower urinary tract symptoms: Secondary | ICD-10-CM | POA: Diagnosis not present

## 2018-07-10 DIAGNOSIS — N529 Male erectile dysfunction, unspecified: Secondary | ICD-10-CM

## 2018-07-10 DIAGNOSIS — Z0001 Encounter for general adult medical examination with abnormal findings: Secondary | ICD-10-CM

## 2018-07-10 DIAGNOSIS — E785 Hyperlipidemia, unspecified: Secondary | ICD-10-CM | POA: Diagnosis not present

## 2018-07-10 DIAGNOSIS — R3989 Other symptoms and signs involving the genitourinary system: Secondary | ICD-10-CM

## 2018-07-10 DIAGNOSIS — R351 Nocturia: Secondary | ICD-10-CM | POA: Diagnosis not present

## 2018-07-10 DIAGNOSIS — I10 Essential (primary) hypertension: Secondary | ICD-10-CM | POA: Diagnosis not present

## 2018-07-10 DIAGNOSIS — Z23 Encounter for immunization: Secondary | ICD-10-CM | POA: Diagnosis not present

## 2018-07-10 DIAGNOSIS — Z0000 Encounter for general adult medical examination without abnormal findings: Secondary | ICD-10-CM

## 2018-07-10 MED ORDER — ZOSTER VAC RECOMB ADJUVANTED 50 MCG/0.5ML IM SUSR: 0.5000 mL | Freq: Once | INTRAMUSCULAR | 1 refills | Status: AC

## 2018-07-10 MED ORDER — LISINOPRIL 10 MG PO TABS: 5.0000 mg | ORAL_TABLET | Freq: Every day | ORAL | 1 refills | Status: DC

## 2018-07-10 MED ORDER — SILDENAFIL CITRATE 100 MG PO TABS: ORAL_TABLET | ORAL | 1 refills | Status: DC

## 2018-07-10 MED ORDER — FINASTERIDE 5 MG PO TABS: 5.0000 mg | ORAL_TABLET | Freq: Every day | ORAL | 1 refills | Status: DC

## 2018-07-10 NOTE — Progress Notes (Signed)
Subjective:    Patient ID: Darrell Wells, male    DOB: [birth year], 68 y.o.   MRN: [id number]  HPI Darrell Wells is a 68 y.o. male Presents today for: Chief Complaint  Patient presents with  . Medicare Wellness   Hypertension: BP Readings from Last 3 Encounters:  07/10/18 132/88  06/09/18 111/74  03/02/18 119/77   Lab Results  Component Value Date   CREATININE 1.36 (H) 03/02/2018  Stable with creatinine 1.33  3 years ago, 1.45  1-year ago. Takes lisinopril 10 mg one half daily, total dose of 5 mg daily. MRI obtained last year without signs of CVA.  Suspected vertigo at that time, treated with meclizine.  Dizziness had improved with increase hydration. still doing well. No new side effects with meds.    BPH:  On Proscar 5 mg daily  for BPH. Helping with urination - able to produce urine, slow stream at times.   Erectile dysfunction: 100 mg Viagra prescribed previously, instructions to take half pill. No blue vision, hearing change or CP with exertion. 4m works well.   Hyperlipidemia:  Lab Results  Component Value Date   CHOL 200 (H) 05/26/2017   HDL 76 05/26/2017   LDLCALC 104 (H) 05/26/2017   TRIG 99 05/26/2017   CHOLHDL 2.6 05/26/2017   Lab Results  Component Value Date   ALT 19 05/26/2017   AST 35 05/26/2017   ALKPHOS 61 05/26/2017   BILITOT 0.4 05/26/2017  watching diet, no meds currently Decreased from LDL 119 3 yrs ago.   Cancer screening: Colonoscopy 06/09/2018 PSA 0.5 on 05/26/2017 - agrees to testing after discussion.  -Biopsy many years ago - 2011 - normal.   Immunization History  Administered Date(s) Administered  . Influenza,inj,Quad PF,6+ Mos 05/03/2014, 05/09/2015, 04/18/2016, 05/26/2017  . Pneumococcal Conjugate-13 05/23/2016  . Pneumococcal Polysaccharide-23 05/26/2017  Shingles vaccine - agrees - will send to pharmacy. Agrees to Tdap - aware of possible out of pocket cost - deferred.   Fall screening -no falls in the past year   [facility] 07/10/2018 11/29/2017 11/24/2017 05/26/2017 10/31/2016  Decreased Interest 0 0 0 0 0  Down, Depressed, Hopeless 0 0 0 0 0  PHQ - 2 Score 0 0 0 0 0   Functional Status Survey: Is the patient deaf or have difficulty hearing?: No Does the patient have difficulty seeing, even when wearing glasses/contacts?: No Does the patient have difficulty concentrating, remembering, or making decisions?: No Does the patient have difficulty walking or climbing stairs?: No Does the patient have difficulty dressing or bathing?: No Does the patient have difficulty doing errands alone such as visiting a doctor's office or shopping?: No    6CIT Screen 07/10/2018  What Year? 0 points  What month? 0 points  What time? 0 points  Count back from 20 0 points  Months in reverse 0 points  Repeat phrase 0 points  Total Score 0    Visual Acuity Screening   Right eye Left eye Both eyes  Without correction: '20/20 20/20 20/20 '  With correction:     optho - no recent visit.   Dental: appt next week.   Exercise: 5 days per week.   Advanced directives -does not have, information was provided.     Patient Active Problem List   Diagnosis Date Noted  . Essential hypertension, benign 05/03/2014  . BPH (benign prostatic hyperplasia) 05/03/2014  . Erectile dysfunction 05/03/2014   Past Medical History:  Diagnosis Date  . Enlarged prostate   .  Hypertension    Past Surgical History:  Procedure Laterality Date  . 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 REPAIR KNEE LIGAMENT Left    No Known Allergies Prior to Admission medications   Medication Sig Start Date End Date Taking? Authorizing Provider  finasteride (PROSCAR) 5 MG tablet Take 1 tablet (5 mg total) by mouth daily. 03/08/18  Yes [name], MD  lisinopril (PRINIVIL,ZESTRIL) 10 MG tablet Take 0.5 tablets (5 mg total) by mouth daily. 03/02/18  Yes [name], MD  sildenafil (VIAGRA) 100 MG tablet TAKE  ONE-HALF (1/2) TO ONE TABLET DAILY AS NEEDED FOR ERECTILE DYSFUNCTION 01/31/18  Yes [name], MD   Social History   Socioeconomic History  . Marital status: Single    Spouse name: Not on file  . Number of children: Not on file  . Years of education: Not on file  . Highest education level: Not on file  Occupational History  . Not on file  Social Needs  . Financial resource strain: Not on file  . Food insecurity:    Worry: Not on file    Inability: Not on file  . Transportation needs:    Medical: Not on file    Non-medical: Not on file  Tobacco Use  . Smoking status: Never Smoker  . Smokeless tobacco: Never Used  Substance and Sexual Activity  . Alcohol use: Yes    Comment: rare  . Drug use: No  . Sexual activity: Yes  Lifestyle  . Physical activity:    Days per week: Not on file    Minutes per session: Not on file  . Stress: Not on file  Relationships  . Social connections:    Talks on phone: Not on file    Gets together: Not on file    Attends religious service: Not on file    Active member of club or organization: Not on file    Attends meetings of clubs or organizations: Not on file    Relationship status: Not on file  . Intimate partner violence:    Fear of current or ex partner: Not on file    Emotionally abused: Not on file    Physically abused: Not on file    Forced sexual activity: Not on file  Other Topics Concern  . Not on file  Social History Narrative  . Not on file    Review of Systems  HENT: Positive for rhinorrhea (min. ).   All other systems reviewed and are negative. occasional rhinorrhea - with dust at work.      Objective:   Physical Exam Vitals signs reviewed.  Constitutional:      Appearance: He is well-developed.  HENT:     Head: Normocephalic and atraumatic.     Right Ear: External ear normal.     Left Ear: External ear normal.  Eyes:     Conjunctiva/sclera: Conjunctivae normal.     Pupils: Pupils are equal, round, and  reactive to light.  Neck:     Musculoskeletal: Normal range of motion and neck supple.     Thyroid: No thyromegaly.  Cardiovascular:     Rate and Rhythm: Normal rate and regular rhythm.     Heart sounds: Normal heart sounds.  Pulmonary:     Effort: Pulmonary effort is normal. No respiratory distress.     Breath sounds: Normal breath sounds. No wheezing.  Abdominal:     General: There is no distension.  Palpations: Abdomen is soft.     Tenderness: There is no abdominal tenderness.     Hernia: There is no hernia in the right inguinal area or left inguinal area.  Genitourinary:    Comments: Prostate enlarged with firm area in the middle to lower right side.  Nontender Musculoskeletal: Normal range of motion.        General: No tenderness.  Lymphadenopathy:     Cervical: No cervical adenopathy.  Skin:    General: Skin is warm and dry.  Neurological:     Mental Status: He is alert and oriented to person, place, and time.     Deep Tendon Reflexes: Reflexes are normal and symmetric.  Psychiatric:        Behavior: Behavior normal.    Vitals:   07/10/18 1403  BP: 132/88  Pulse: 67  Resp: 16  Temp: 97.8 F (36.6 C)  TempSrc: Oral  SpO2: 98%  Weight: 160 lb 6.1 oz (72.7 kg)  Height: '5\' 9"'  (1.753 m)       Assessment & Plan:    Darrell Wells is a 68 y.o. male Wellness examination - Plan: Visual acuity screening  - - anticipatory guidance as below in AVS, screening labs if needed. Health maintenance items as above in HPI discussed/recommended as applicable.  - no concerning responses on depression, fall, or functional status screening. Any positive responses noted as above. Advanced directives discussed as in CH[facility].   Need for prophylactic vaccination and inoculation against influenza - Plan: Flu vaccine HIGH DOSE PF (Fluzone High dose)  Need for shingles vaccine - Plan: Zoster Vaccine Adjuvanted [facility]) injection to pharmacy  Hyperlipidemia, unspecified hyperlipidemia  type - Plan: Comprehensive metabolic panel, Lipid panel  -Check labs, determine statin need.  Need for Tdap vaccination - Plan: Tdap vaccine greater than or equal to 7yo IM, potential cost out of pocket discussed. - deferred/not given.   Screening for prostate cancer - Plan: PSA Benign prostatic hyperplasia with weak urinary stream - Plan: PSA, Ambulatory referral to Urology Benign prostatic hyperplasia with nocturia - Plan: finasteride (PROSCAR) 5 MG tablet Abnormal prostate exam - Plan: Ambulatory referral to Urology  -Firm, possible nodular area midline toward right.  Nontender.  Repeat PSA, continue Proscar for now, refer to urology for further evaluation/repeat exam.  -We discussed pros and cons of prostate cancer screening, and after this discussion, he chose to have screening done as above.  PSA obtained.   Essential hypertension, benign - Plan: lisinopril (PRINIVIL,ZESTRIL) 10 MG tablet  -Stable, continue same dose lisinopril.  Labs pending  Erectile dysfunction, unspecified erectile dysfunction type - Plan: sildenafil (VIAGRA) 100 MG tablet  -viagra Rx given - use lowest effective dose. Side effects discussed (including but not limited to headache/flushing, blue discoloration of vision, possible vascular steal and risk of cardiac effects if underlying unknown coronary artery disease, and permanent sensorineural hearing loss). Understanding expressed.   Meds ordered this encounter  Medications  . Zoster Vaccine Adjuvanted [facility]) injection    Sig: Inject 0.5 mLs into the muscle once for 1 dose. Repeat in 2-6 months.    Dispense:  0.5 mL    Refill:  1  . lisinopril (PRINIVIL,ZESTRIL) 10 MG tablet    Sig: Take 0.5 tablets (5 mg total) by mouth daily.    Dispense:  45 tablet    Refill:  1  . finasteride (PROSCAR) 5 MG tablet    Sig: Take 1 tablet (5 mg total) by mouth daily.    Dispense:  90  tablet    Refill:  1  . sildenafil (VIAGRA) 100 MG tablet    Sig: TAKE ONE-HALF  (1/2) TO ONE TABLET DAILY AS NEEDED FOR ERECTILE DYSFUNCTION    Dispense:  18 tablet    Refill:  1   Patient Instructions     I will refer you to urology to evaluate possible firm area on the prostate exam.  Can also discuss other treatments if needed for the BPH and discuss semen analysis if needed.  No other change in medicines today.  Follow-up in 6 months.  Thank you for coming in today.   Preventive Care 76 Years and Older, Male Preventive care refers to lifestyle choices and visits with your health care provider that can promote health and wellness. What does preventive care include?   A yearly physical exam. This is also called an annual well check.  Dental exams once or twice a year.  Routine eye exams. Ask your health care provider how often you should have your eyes checked.  Personal lifestyle choices, including: ? Daily care of your teeth and gums. ? Regular physical activity. ? Eating a healthy diet. ? Avoiding tobacco and drug use. ? Limiting alcohol use. ? Practicing safe sex. ? Taking low doses of aspirin every day. ? Taking vitamin and mineral supplements as recommended by your health care provider. What happens during an annual well check? The services and screenings done by your health care provider during your annual well check will depend on your age, overall health, lifestyle risk factors, and family history of disease. Counseling Your health care provider may ask you questions about your:  Alcohol use.  Tobacco use.  Drug use.  Emotional well-being.  Home and relationship well-being.  Sexual activity.  Eating habits.  History of falls.  Memory and ability to understand (cognition).  Work and work Statistician. Screening You may have the following tests or measurements:  Height, weight, and BMI.  Blood pressure.  Lipid and cholesterol levels. These may be checked every 5 years, or more frequently if you are over 87 years old.   Skin check.  Lung cancer screening. You may have this screening every year starting at age 3 if you have a 30-pack-year history of smoking and currently smoke or have quit within the past 15 years.  Colorectal cancer screening. All adults should have this screening starting at age 47 and continuing until age 18. You will have tests every 1-10 years, depending on your results and the type of screening test. People at increased risk should start screening at an earlier age. Screening tests may include: ? Guaiac-based fecal occult blood testing. ? Fecal immunochemical test (FIT). ? Stool DNA test. ? Virtual colonoscopy. ? Sigmoidoscopy. During this test, a flexible tube with a tiny camera (sigmoidoscope) is used to examine your rectum and lower colon. The sigmoidoscope is inserted through your anus into your rectum and lower colon. ? Colonoscopy. During this test, a long, thin, flexible tube with a tiny camera (colonoscope) is used to examine your entire colon and rectum.  Prostate cancer screening. Recommendations will vary depending on your family history and other risks.  Hepatitis C blood test.  Hepatitis B blood test.  Sexually transmitted disease (STD) testing.  Diabetes screening. This is done by checking your blood sugar (glucose) after you have not eaten for a while (fasting). You may have this done every 1-3 years.  Abdominal aortic aneurysm (AAA) screening. You may need this if you are a current or  former smoker.  Osteoporosis. You may be screened starting at age 33 if you are at high risk. Talk with your health care provider about your test results, treatment options, and if necessary, the need for more tests. Vaccines Your health care provider may recommend certain vaccines, such as:  Influenza vaccine. This is recommended every year.  Tetanus, diphtheria, and acellular pertussis (Tdap, Td) vaccine. You may need a Td booster every 10 years.  Varicella vaccine. You may need  this if you have not been vaccinated.  Zoster vaccine. You may need this after age 42.  Measles, mumps, and rubella (MMR) vaccine. You may need at least one dose of MMR if you were born in 1957 or later. You may also need a second dose.  Pneumococcal 13-valent conjugate (PCV13) vaccine. One dose is recommended after age 44.  Pneumococcal polysaccharide (PPSV23) vaccine. One dose is recommended after age 77.  Meningococcal vaccine. You may need this if you have certain conditions.  Hepatitis A vaccine. You may need this if you have certain conditions or if you travel or work in places where you may be exposed to hepatitis A.  Hepatitis B vaccine. You may need this if you have certain conditions or if you travel or work in places where you may be exposed to hepatitis B.  Haemophilus influenzae type b (Hib) vaccine. You may need this if you have certain risk factors. Talk to your health care provider about which screenings and vaccines you need and how often you need them. This information is not intended to replace advice given to you by your health care provider. Make sure you discuss any questions you have with your health care provider. Document Released: 05/12/2015 Document Revised: 06/05/2017 Document Reviewed: 02/14/2015 Elsevier Interactive Patient Education  [employer].    If you have lab work done today you will be contacted with your lab results within the next 2 weeks.  If you have not heard from [place] then please contact us. The fastest way to get your results is to register for My Chart.   IF you received an x-ray today, you will receive an invoice from [facility]. Please contact [facility] at [phone] with questions or concerns regarding your invoice.   IF you received labwork today, you will receive an invoice from [facility]. Please contact [facility] at [phone] with questions or concerns regarding your invoice.   Our billing staff will not  be able to assist you with questions regarding bills from these companies.  You will be contacted with the lab results as soon as they are available. The fastest way to get your results is to activate your My Chart account. Instructions are located on the last page of this paperwork. If you have not heard from [place] regarding the results in 2 weeks, please contact this office.       Signed,   Merri Ray, MD [facility].  07/12/18 11:04 AM

## 2018-07-10 NOTE — Patient Instructions (Addendum)
I will refer you to urology to evaluate possible firm area on the prostate exam.  Can also discuss other treatments if needed for the BPH and discuss semen analysis if needed.  No other change in medicines today.  Follow-up in 6 months.  Thank you for coming in today.   Preventive Care 68 Years and Older, Male Preventive care refers to lifestyle choices and visits with your health care provider that can promote health and wellness. What does preventive care include?   A yearly physical exam. This is also called an annual well check.  Dental exams once or twice a year.  Routine eye exams. Ask your health care provider how often you should have your eyes checked.  Personal lifestyle choices, including: ? Daily care of your teeth and gums. ? Regular physical activity. ? Eating a healthy diet. ? Avoiding tobacco and drug use. ? Limiting alcohol use. ? Practicing safe sex. ? Taking low doses of aspirin every day. ? Taking vitamin and mineral supplements as recommended by your health care provider. What happens during an annual well check? The services and screenings done by your health care provider during your annual well check will depend on your age, overall health, lifestyle risk factors, and family history of disease. Counseling Your health care provider may ask you questions about your:  Alcohol use.  Tobacco use.  Drug use.  Emotional well-being.  Home and relationship well-being.  Sexual activity.  Eating habits.  History of falls.  Memory and ability to understand (cognition).  Work and work Statistician. Screening You may have the following tests or measurements:  Height, weight, and BMI.  Blood pressure.  Lipid and cholesterol levels. These may be checked every 5 years, or more frequently if you are over 60 years old.  Skin check.  Lung cancer screening. You may have this screening every year starting at age 15 if you have a 30-pack-year history of  smoking and currently smoke or have quit within the past 15 years.  Colorectal cancer screening. All adults should have this screening starting at age 28 and continuing until age 72. You will have tests every 1-10 years, depending on your results and the type of screening test. People at increased risk should start screening at an earlier age. Screening tests may include: ? Guaiac-based fecal occult blood testing. ? Fecal immunochemical test (FIT). ? Stool DNA test. ? Virtual colonoscopy. ? Sigmoidoscopy. During this test, a flexible tube with a tiny camera (sigmoidoscope) is used to examine your rectum and lower colon. The sigmoidoscope is inserted through your anus into your rectum and lower colon. ? Colonoscopy. During this test, a long, thin, flexible tube with a tiny camera (colonoscope) is used to examine your entire colon and rectum.  Prostate cancer screening. Recommendations will vary depending on your family history and other risks.  Hepatitis C blood test.  Hepatitis B blood test.  Sexually transmitted disease (STD) testing.  Diabetes screening. This is done by checking your blood sugar (glucose) after you have not eaten for a while (fasting). You may have this done every 1-3 years.  Abdominal aortic aneurysm (AAA) screening. You may need this if you are a current or former smoker.  Osteoporosis. You may be screened starting at age 69 if you are at high risk. Talk with your health care provider about your test results, treatment options, and if necessary, the need for more tests. Vaccines Your health care provider may recommend certain vaccines, such as:  Influenza  vaccine. This is recommended every year.  Tetanus, diphtheria, and acellular pertussis (Tdap, Td) vaccine. You may need a Td booster every 10 years.  Varicella vaccine. You may need this if you have not been vaccinated.  Zoster vaccine. You may need this after age 26.  Measles, mumps, and rubella (MMR)  vaccine. You may need at least one dose of MMR if you were born in 1957 or later. You may also need a second dose.  Pneumococcal 13-valent conjugate (PCV13) vaccine. One dose is recommended after age 63.  Pneumococcal polysaccharide (PPSV23) vaccine. One dose is recommended after age 50.  Meningococcal vaccine. You may need this if you have certain conditions.  Hepatitis A vaccine. You may need this if you have certain conditions or if you travel or work in places where you may be exposed to hepatitis A.  Hepatitis B vaccine. You may need this if you have certain conditions or if you travel or work in places where you may be exposed to hepatitis B.  Haemophilus influenzae type b (Hib) vaccine. You may need this if you have certain risk factors. Talk to your health care provider about which screenings and vaccines you need and how often you need them. This information is not intended to replace advice given to you by your health care provider. Make sure you discuss any questions you have with your health care provider. Document Released: 05/12/2015 Document Revised: 06/05/2017 Document Reviewed: 02/14/2015 Elsevier Interactive Patient Education  [employer].    If you have lab work done today you will be contacted with your lab results within the next 2 weeks.  If you have not heard from [place] then please contact us. The fastest way to get your results is to register for My Chart.   IF you received an x-ray today, you will receive an invoice from [facility]. Please contact [facility] at [phone] with questions or concerns regarding your invoice.   IF you received labwork today, you will receive an invoice from [place]. Please contact [facility] at [phone] with questions or concerns regarding your invoice.   Our billing staff will not be able to assist you with questions regarding bills from these companies.  You will be contacted with the lab results as  soon as they are available. The fastest way to get your results is to activate your My Chart account. Instructions are located on the last page of this paperwork. If you have not heard from [place] regarding the results in 2 weeks, please contact this office.

## 2018-07-11 LAB — LIPID PANEL
Chol/HDL Ratio: 2.4 ratio (ref 0.0–5.0)
Cholesterol, Total: 188 mg/dL (ref 100–199)
HDL: 80 mg/dL (ref >39)
LDL Calculated: 97 mg/dL (ref 0–99)
Triglycerides: 53 mg/dL (ref 0–149)
VLDL Cholesterol Cal: 11 mg/dL (ref 5–40)

## 2018-07-11 LAB — COMPREHENSIVE METABOLIC PANEL
ALT: 16 IU/L (ref 0–44)
AST: 23 IU/L (ref 0–40)
Albumin/Globulin Ratio: 1.5 (ref 1.2–2.2)
Albumin: 4.4 g/dL (ref 3.8–4.8)
Alkaline Phosphatase: 59 IU/L (ref 39–117)
BUN/Creatinine Ratio: 11 (ref 10–24)
BUN: 13 mg/dL (ref 8–27)
Bilirubin Total: 0.7 mg/dL (ref 0.0–1.2)
CO2: 21 mmol/L (ref 20–29)
Calcium: 9.9 mg/dL (ref 8.6–10.2)
Chloride: 102 mmol/L (ref 96–106)
Creatinine, Ser: 1.21 mg/dL (ref 0.76–1.27)
GFR calc non Af Amer: 62 mL/min/{1.73_m2} (ref >59)
GFR, EST AFRICAN AMERICAN: 71 mL/min/{1.73_m2} (ref >59)
GLOBULIN, TOTAL: 3 g/dL (ref 1.5–4.5)
Glucose: 82 mg/dL (ref 65–99)
Potassium: 4.4 mmol/L (ref 3.5–5.2)
Sodium: 139 mmol/L (ref 134–144)
Total Protein: 7.4 g/dL (ref 6.0–8.5)

## 2018-07-11 LAB — PSA: PROSTATE SPECIFIC AG, SERUM: 0.5 ng/mL (ref 0.0–4.0)

## 2018-07-13 ENCOUNTER — Encounter: Payer: Self-pay | Admitting: Radiology

## 2018-07-22 ENCOUNTER — Encounter: Payer: Self-pay | Admitting: Family Medicine

## 2018-08-11 ENCOUNTER — Other Ambulatory Visit: Payer: Self-pay | Admitting: Family Medicine

## 2018-08-11 DIAGNOSIS — I10 Essential (primary) hypertension: Secondary | ICD-10-CM

## 2018-08-18 ENCOUNTER — Other Ambulatory Visit: Payer: Self-pay

## 2018-08-18 ENCOUNTER — Ambulatory Visit (INDEPENDENT_AMBULATORY_CARE_PROVIDER_SITE_OTHER): Payer: Medicare Other | Admitting: Family Medicine

## 2018-08-18 VITALS — BP 132/88 | Ht 69.0 in | Wt 160.0 lb

## 2018-08-18 DIAGNOSIS — Z0000 Encounter for general adult medical examination without abnormal findings: Secondary | ICD-10-CM

## 2018-08-18 NOTE — Progress Notes (Signed)
Presents today for [employer] Visit   Date of last exam: 07/10/2018  Interpreter used for this visit? NO   Patient Care Team: [name], MD as PCP - General (Family Medicine)   Other items to address today:  Discussed yearly Eye Exams, Dental Discussed Shingrix, TDAP     Other Screening:  Last lipid screening: 07/10/2018  ADVANCE DIRECTIVES: Discussed: yes On File: no Materials Provided: no  Copy requested  Immunization status:  Immunization History  Administered Date(s) Administered  . Influenza, High Dose Seasonal PF 07/10/2018  . Influenza,inj,Quad PF,6+ Mos 05/03/2014, 05/09/2015, 04/18/2016, 05/26/2017  . Pneumococcal Conjugate-13 05/23/2016  . Pneumococcal Polysaccharide-23 05/26/2017     There are no preventive care reminders to display for this patient.   Functional Status Survey: Is the patient deaf or have difficulty hearing?: No Does the patient have difficulty seeing, even when wearing glasses/contacts?: No Does the patient have difficulty concentrating, remembering, or making decisions?: No Does the patient have difficulty walking or climbing stairs?: No Does the patient have difficulty dressing or bathing?: No Does the patient have difficulty doing errands alone such as visiting a doctor's office or shopping?: No   6CIT Screen 08/18/2018 07/10/2018  What Year? 0 points 0 points  What month? 0 points 0 points  What time? 0 points 0 points  Count back from 20 0 points 0 points  Months in reverse 0 points 0 points  Repeat phrase 0 points 0 points  Total Score 0 0    EXERCISE:   Run outside 5 -6 miles Light weights cardio  Belongs to gym will resume when reopens    Home Environment:   Lives in two story home by himself  No trouble climbing stairs Has grab bars. No scattered rugs Well lit.    Patient Active Problem List   Diagnosis Date Noted  . Essential hypertension, benign 05/03/2014  . BPH  (benign prostatic hyperplasia) 05/03/2014  . Erectile dysfunction 05/03/2014     Past Medical History:  Diagnosis Date  . Enlarged prostate   . Hypertension      Past Surgical History:  Procedure Laterality Date  . 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 REPAIR KNEE LIGAMENT Left      Family History  Problem Relation Age of Onset  . Colon cancer Neg Hx   . Esophageal cancer Neg Hx   . Rectal cancer Neg Hx   . Stomach cancer Neg Hx      Social History   Socioeconomic History  . Marital status: Single    Spouse name: Not on file  . Number of children: Not on file  . Years of education: Not on file  . Highest education level: Not on file  Occupational History  . Not on file  Social Needs  . Financial resource strain: Not on file  . Food insecurity:    Worry: Not on file    Inability: Not on file  . Transportation needs:    Medical: Not on file    Non-medical: Not on file  Tobacco Use  . Smoking status: Never Smoker  . Smokeless tobacco: Never Used  Substance and Sexual Activity  . Alcohol use: Yes    Comment: rare  . Drug use: No  . Sexual activity: Yes  Lifestyle  . Physical activity:    Days per week: Not on file    Minutes per session: Not on file  . Stress: Not on file  Relationships  . Social connections:    Talks on phone: Not on file    Gets together: Not on file    Attends religious service: Not on file    Active member of club or organization: Not on file    Attends meetings of clubs or organizations: Not on file    Relationship status: Not on file  . Intimate partner violence:    Fear of current or ex partner: Not on file    Emotionally abused: Not on file    Physically abused: Not on file    Forced sexual activity: Not on file  Other Topics Concern  . Not on file  Social History Narrative  . Not on file     No Known Allergies   Prior to Admission medications   Medication Sig Start Date End Date Taking?  Authorizing Provider  finasteride (PROSCAR) 5 MG tablet Take 1 tablet (5 mg total) by mouth daily. 07/10/18  Yes [name], MD  lisinopril (PRINIVIL,ZESTRIL) 10 MG tablet TAKE ONE-HALF (1/2) TABLET DAILY 08/11/18  Yes [name], MD  sildenafil (VIAGRA) 100 MG tablet TAKE ONE-HALF (1/2) TO ONE TABLET DAILY AS NEEDED FOR ERECTILE DYSFUNCTION 07/10/18  Yes [name], MD     Depression screen [facility] 2/9 08/18/2018 07/10/2018 11/29/2017 11/24/2017 05/26/2017  Decreased Interest 0 0 0 0 0  Down, Depressed, Hopeless 0 0 0 0 0  PHQ - 2 Score 0 0 0 0 0     Fall Risk  08/18/2018 07/10/2018 03/02/2018 11/29/2017 11/24/2017  Falls in the past year? 0 0 0 No No  Number falls in past yr: 0 0 - - -  Injury with Fall? 0 0 - - -  Risk for fall due to : - History of fall(s) - - -      PHYSICAL EXAM: Ht 5\' 9"  (1.753 m)   Wt 160 lb (72.6 kg)   BMI 23.63 kg/m    Wt Readings from Last 3 Encounters:  08/18/18 160 lb (72.6 kg)  07/10/18 160 lb 6.1 oz (72.7 kg)  06/09/18 161 lb (73 kg)     No exam data present    Physical Exam   Education/Counseling provided regarding diet and exercise, prevention of chronic diseases, smoking/tobacco cessation, if applicable, and reviewed "Covered Medicare Preventive Services."   ASSESSMENT/PLAN: There are no diagnoses linked to this encounter.

## 2018-08-18 NOTE — Patient Instructions (Signed)
Thank you for taking time to come for your Medicare Wellness Visit. I appreciate your ongoing commitment to your health goals. Please review the following plan we discussed and let me know if I can assist you in the future.  Julie Greer LPN  Healthy Eating Following a healthy eating pattern may help you to achieve and maintain a healthy body weight, reduce the risk of chronic disease, and live a long and productive life. It is important to follow a healthy eating pattern at an appropriate calorie level for your body. Your nutritional needs should be met primarily through food by choosing a variety of nutrient-rich foods. What are tips for following this plan? Reading food labels  Read labels and choose the following: ? Reduced or low sodium. ? Juices with 100% fruit juice. ? Foods with low saturated fats and high polyunsaturated and monounsaturated fats. ? Foods with whole grains, such as whole wheat, cracked wheat, brown rice, and wild rice. ? Whole grains that are fortified with folic acid. This is recommended for women who are pregnant or who want to become pregnant.  Read labels and avoid the following: ? Foods with a lot of added sugars. These include foods that contain brown sugar, corn sweetener, corn syrup, dextrose, fructose, glucose, high-fructose corn syrup, honey, invert sugar, lactose, malt syrup, maltose, molasses, raw sugar, sucrose, trehalose, or turbinado sugar.  Do not eat more than the following amounts of added sugar per day:  6 teaspoons (25 g) for women.  9 teaspoons (38 g) for men. ? Foods that contain processed or refined starches and grains. ? Refined grain products, such as white flour, degermed cornmeal, white bread, and white rice. Shopping  Choose nutrient-rich snacks, such as vegetables, whole fruits, and nuts. Avoid high-calorie and high-sugar snacks, such as potato chips, fruit snacks, and candy.  Use oil-based dressings and spreads on foods instead of  solid fats such as butter, stick margarine, or cream cheese.  Limit pre-made sauces, mixes, and "instant" products such as flavored rice, instant noodles, and ready-made pasta.  Try more plant-protein sources, such as tofu, tempeh, black beans, edamame, lentils, nuts, and seeds.  Explore eating plans such as the Mediterranean diet or vegetarian diet. Cooking  Use oil to saut or stir-fry foods instead of solid fats such as butter, stick margarine, or lard.  Try baking, boiling, grilling, or broiling instead of frying.  Remove the fatty part of meats before cooking.  Steam vegetables in water or broth. Meal planning   At meals, imagine dividing your plate into fourths: ? One-half of your plate is fruits and vegetables. ? One-fourth of your plate is whole grains. ? One-fourth of your plate is protein, especially lean meats, poultry, eggs, tofu, beans, or nuts.  Include low-fat dairy as part of your daily diet. Lifestyle  Choose healthy options in all settings, including home, work, school, restaurants, or stores.  Prepare your food safely: ? Wash your hands after handling raw meats. ? Keep food preparation surfaces clean by regularly washing with hot, soapy water. ? Keep raw meats separate from ready-to-eat foods, such as fruits and vegetables. ? Cook seafood, meat, poultry, and eggs to the recommended internal temperature. ? Store foods at safe temperatures. In general:  Keep cold foods at 40F (4.4C) or below.  Keep hot foods at 140F (60C) or above.  Keep your freezer at 0F (-17.8C) or below.  Foods are no longer safe to eat when they have been between the temperatures of 40-140F (4.4-60C) for   more than 2 hours. What foods should I eat? Fruits Aim to eat 2 cup-equivalents of fresh, canned (in natural juice), or frozen fruits each day. Examples of 1 cup-equivalent of fruit include 1 small apple, 8 large strawberries, 1 cup canned fruit,  cup dried fruit, or 1 cup  100% juice. Vegetables Aim to eat 2-3 cup-equivalents of fresh and frozen vegetables each day, including different varieties and colors. Examples of 1 cup-equivalent of vegetables include 2 medium carrots, 2 cups raw, leafy greens, 1 cup chopped vegetable (raw or cooked), or 1 medium baked potato. Grains Aim to eat 6 ounce-equivalents of whole grains each day. Examples of 1 ounce-equivalent of grains include 1 slice of bread, 1 cup ready-to-eat cereal, 3 cups popcorn, or  cup cooked rice, pasta, or cereal. Meats and other proteins Aim to eat 5-6 ounce-equivalents of protein each day. Examples of 1 ounce-equivalent of protein include 1 egg, 1/2 cup nuts or seeds, or 1 tablespoon (16 g) peanut butter. A cut of meat or fish that is the size of a deck of cards is about 3-4 ounce-equivalents.  Of the protein you eat each week, try to have at least 8 ounces come from seafood. This includes salmon, trout, herring, and anchovies. Dairy Aim to eat 3 cup-equivalents of fat-free or low-fat dairy each day. Examples of 1 cup-equivalent of dairy include 1 cup (240 mL) milk, 8 ounces (250 g) yogurt, 1 ounces (44 g) natural cheese, or 1 cup (240 mL) fortified soy milk. Fats and oils  Aim for about 5 teaspoons (21 g) per day. Choose monounsaturated fats, such as canola and olive oils, avocados, peanut butter, and most nuts, or polyunsaturated fats, such as sunflower, corn, and soybean oils, walnuts, pine nuts, sesame seeds, sunflower seeds, and flaxseed. Beverages  Aim for six 8-oz glasses of water per day. Limit coffee to three to five 8-oz cups per day.  Limit caffeinated beverages that have added calories, such as soda and energy drinks.  Limit alcohol intake to no more than 1 drink a day for nonpregnant women and 2 drinks a day for men. One drink equals 12 oz of beer (355 mL), 5 oz of wine (148 mL), or 1 oz of hard liquor (44 mL). Seasoning and other foods  Avoid adding excess amounts of salt to your  foods. Try flavoring foods with herbs and spices instead of salt.  Avoid adding sugar to foods.  Try using oil-based dressings, sauces, and spreads instead of solid fats. This information is based on general [place] nutrition guidelines. For more information, visit [employer].es. Exact amounts may vary based on your nutrition needs. Summary  A healthy eating plan may help you to maintain a healthy weight, reduce the risk of chronic diseases, and stay active throughout your life.  Plan your meals. Make sure you eat the right portions of a variety of nutrient-rich foods.  Try baking, boiling, grilling, or broiling instead of frying.  Choose healthy options in all settings, including home, work, school, restaurants, or stores. This information is not intended to replace advice given to you by your health care provider. Make sure you discuss any questions you have with your health care provider. Document Released: 07/28/2017 Document Revised: 07/28/2017 Document Reviewed: 07/28/2017 Elsevier Interactive Patient Education  2019 [employer].

## 2018-08-31 ENCOUNTER — Ambulatory Visit: Payer: Medicare Other | Admitting: Family Medicine

## 2018-09-04 ENCOUNTER — Other Ambulatory Visit: Payer: Self-pay | Admitting: Family Medicine

## 2018-09-04 DIAGNOSIS — R351 Nocturia: Secondary | ICD-10-CM

## 2018-09-04 DIAGNOSIS — N401 Enlarged prostate with lower urinary tract symptoms: Secondary | ICD-10-CM

## 2018-10-03 DIAGNOSIS — Z1159 Encounter for screening for other viral diseases: Secondary | ICD-10-CM | POA: Diagnosis not present

## 2018-10-03 DIAGNOSIS — Z03818 Encounter for observation for suspected exposure to other biological agents ruled out: Secondary | ICD-10-CM | POA: Diagnosis not present

## 2018-10-03 DIAGNOSIS — Z7189 Other specified counseling: Secondary | ICD-10-CM | POA: Diagnosis not present

## 2018-11-12 ENCOUNTER — Telehealth: Payer: Self-pay | Admitting: Family Medicine

## 2018-11-12 NOTE — Telephone Encounter (Signed)
Medication Refill - Medication:  meclizine (ANTIVERT) 25 MG tablet   Has the patient contacted their pharmacy? Yes, advised to call.  Preferred Pharmacy (with phone number or street name):  [facility], [place] [phone] (Phone) [phone] (Fax)   Agent: Please be advised that RX refills may take up to 3 business days. We ask that you follow-up with your pharmacy.

## 2018-11-15 ENCOUNTER — Other Ambulatory Visit: Payer: Self-pay | Admitting: Family Medicine

## 2018-11-15 DIAGNOSIS — N529 Male erectile dysfunction, unspecified: Secondary | ICD-10-CM

## 2018-11-15 NOTE — Telephone Encounter (Signed)
Left message on voicemail to return pt call.  Pt requesting meclizine and this is not on pt med list as this med is for motion sickness and vertigo. (If pt is experiencing these symptoms he will need to be seen in the office ). Dgaddy, CMA

## 2018-11-15 NOTE — Telephone Encounter (Signed)
Requested Prescriptions  Pending Prescriptions Disp Refills  . sildenafil (VIAGRA) 100 MG tablet [Pharmacy Med Name: SILDENAFIL TABS 100MG ] 18 tablet 3    Sig: TAKE ONE-HALF (1/2) TO ONE TABLET DAILY AS NEEDED FOR ERECTILE DYSFUNCTION     Urology: Erectile Dysfunction Agents Passed - 11/15/2018  6:15 AM      Passed - Last BP in normal range    BP Readings from Last 1 Encounters:  08/18/18 132/88         Passed - Valid encounter within last 12 months    Recent Outpatient Visits          2 months ago Medicare annual wellness visit, subsequent   [facility] [place], Ranell Patrick, MD   4 months ago Wellness examination   [facility] [place], MD   8 months ago Essential hypertension   [facility] [place], Ranell Patrick, MD   11 months ago Essential hypertension   [facility] [place], Ranell Patrick, MD   11 months ago Dizziness   [facility] [place], Ranell Patrick, MD      Future Appointments            In 1 month [name] Ranell Patrick, MD [facility] [place], [facility]

## 2018-11-17 ENCOUNTER — Telehealth (INDEPENDENT_AMBULATORY_CARE_PROVIDER_SITE_OTHER): Payer: Medicare Other | Admitting: Family Medicine

## 2018-11-17 ENCOUNTER — Other Ambulatory Visit: Payer: Self-pay

## 2018-11-17 DIAGNOSIS — R42 Dizziness and giddiness: Secondary | ICD-10-CM

## 2018-11-17 MED ORDER — MECLIZINE HCL 25 MG PO TABS: 25.0000 mg | ORAL_TABLET | Freq: Three times a day (TID) | ORAL | 2 refills | Status: DC | PRN

## 2018-11-17 NOTE — Patient Instructions (Addendum)
     Make sure to drink plenty of fluids - especially when hot outside. Monitor for low blood pressure and if lower readings - follow up to review meds.  I refilled the meclizine, but if you noticed that the vertigo spells becoming more frequent or more prolonged time until improvement, I would recommend meeting with neurology.  Let me know and I can place that referral. Return to the clinic or go to the nearest emergency room if any of your symptoms worsen or new symptoms occur.   I would still recommend meeting with urology to evaluate the possible abnormality on the prostate exam in March.  We have sent a referral, so hears their phone number to call and schedule an appointment.  Let me know if they need more information.  [facility]: [phone]  Take care, let me know if there are questions.     If you have lab work done today you will be contacted with your lab results within the next 2 weeks.  If you have not heard from [place] then please contact us. The fastest way to get your results is to register for My Chart.   IF you received an x-ray today, you will receive an invoice from [facility]. Please contact [facility] at [phone] with questions or concerns regarding your invoice.   IF you received labwork today, you will receive an invoice from [place]. Please contact [facility] at [phone] with questions or concerns regarding your invoice.   Our billing staff will not be able to assist you with questions regarding bills from these companies.  You will be contacted with the lab results as soon as they are available. The fastest way to get your results is to activate your My Chart account. Instructions are located on the last page of this paperwork. If you have not heard from [place] regarding the results in 2 weeks, please contact this office.

## 2018-11-17 NOTE — Progress Notes (Signed)
Virtual Visit via Telephone Note  I connected with Darrell Wells on 11/17/18 at 2:37 PM by telephone and verified that I am speaking with the correct person using two identifiers.   I discussed the limitations, risks, security and privacy concerns of performing an evaluation and management service by telephone and the availability of in person appointments. I also discussed with the patient that there may be a patient responsible charge related to this service. The patient expressed understanding and agreed to proceed, consent obtained  Chief complaint:  Medication refill  History of Present Illness: Darrell Wells is a 68 y.o. male  Episodic vertigo: Has meclizine as needed.  Previous MRI of brain without signs of acute CVA or acute findings in July 2019.   July 2019.  Meclizine helps symptoms as did increase hydration.  Notes 3-4 times per year, prior 4 days. Now can last for 1-2 weeks. Last episode last Tuesday. Ran out of meclizine last week. Dizziness is better, notes with turning head quickly or bending forward - spinning sensation. No facial droop, no focal weakness, no slurred speech.  More of a room spinning sensation that lightheadedness. BP last week - 113/84 when symptomatic, 116/86.    Hypertension: BP Readings from Last 3 Encounters:  08/18/18 132/88  07/10/18 132/88  06/09/18 111/74   Lab Results  Component Value Date   CREATININE 1.21 07/10/2018   Discussed in March.  Stable at that time, continued on lisinopril 5mg  qd. Usually in 120/80-90 range.   Erectile dysfunction: Tolerated previously without new side effects, refilled in March with potential risks and side effects discussed at that time, and instructed lowest effective dose- takes 1/2 pill (50mg ). Not lightheaded with that medicine.   Benign prostatic hypertrophy with abnormal exam.: Firm/possible nodular area on the right of his prostate in March. He was continued on Proscar and referred to [facility].  PSA was normal at 0.5.    Patient Active Problem List   Diagnosis Date Noted  . Essential hypertension, benign 05/03/2014  . BPH (benign prostatic hyperplasia) 05/03/2014  . Erectile dysfunction 05/03/2014   Past Medical History:  Diagnosis Date  . Enlarged prostate   . Hypertension    Past Surgical History:  Procedure Laterality Date  . 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 REPAIR KNEE LIGAMENT Left    No Known Allergies Prior to Admission medications   Medication Sig Start Date End Date Taking? Authorizing Provider  finasteride (PROSCAR) 5 MG tablet Take 1 tablet (5 mg total) by mouth daily. 07/10/18  Yes [name], MD  lisinopril (PRINIVIL,ZESTRIL) 10 MG tablet TAKE ONE-HALF (1/2) TABLET DAILY 08/11/18  Yes [name], MD  meclizine (ANTIVERT) 25 MG tablet Take 25 mg by mouth 3 (three) times daily as needed for dizziness.   Yes [provider]  sildenafil (VIAGRA) 100 MG tablet TAKE ONE-HALF (1/2) TO ONE TABLET DAILY AS NEEDED FOR ERECTILE DYSFUNCTION 11/15/18  Yes [name], MD   Social History   Socioeconomic History  . Marital status: Single    Spouse name: Not on file  . Number of children: Not on file  . Years of education: Not on file  . Highest education level: Not on file  Occupational History  . Not on file  Social Needs  . Financial resource strain: Not on file  . Food insecurity    Worry: Not on file    Inability: Not on file  . Transportation needs  Medical: Not on file    Non-medical: Not on file  Tobacco Use  . Smoking status: Never Smoker  . Smokeless tobacco: Never Used  Substance and Sexual Activity  . Alcohol use: Yes    Comment: rare  . Drug use: No  . Sexual activity: Yes  Lifestyle  . Physical activity    Days per week: Not on file    Minutes per session: Not on file  . Stress: Not on file  Relationships  . Social Herbalist on phone: Not on file    Gets together:  Not on file    Attends religious service: Not on file    Active member of club or organization: Not on file    Attends meetings of clubs or organizations: Not on file    Relationship status: Not on file  . Intimate partner violence    Fear of current or ex partner: Not on file    Emotionally abused: Not on file    Physically abused: Not on file    Forced sexual activity: Not on file  Other Topics Concern  . Not on file  Social History Narrative  . Not on file     Observations/Objective: There were no vitals filed for this visit. No home vitals recorded.   Speaking in complete sentences, intelligible speech.  Appropriate responses, no distress, understanding expressed and all questions answered.  Assessment and Plan: Vertigo - Plan: meclizine (ANTIVERT) 25 MG tablet,  -Still suspected peripheral vertigo, previous neuroimaging reassuring.  Similar symptoms, possibly for a few additional days.  No other focal neurologic symptoms.  Still could have component of blood pressure or hydration but reproduction of symptoms with head movement still suggest vertigo.  -Continue meclizine as needed, ER precautions given if acute worsening symptoms or focal neurologic symptoms.  -If more frequent flares, or continued prolonged flares, would recommend meeting with neurology.  -Continue to monitor blood pressure, maintain hydration, RTC precautions given.  -Follow-up with urology regarding previous abnormal prostate exam, phone number provided.   Follow Up Instructions: As needed.   Patient Instructions       Make sure to drink plenty of fluids - especially when hot outside. Monitor for low blood pressure and if lower readings - follow up to review meds.  I refilled the meclizine, but if you noticed that the vertigo spells becoming more frequent or more prolonged time until improvement, I would recommend meeting with neurology.  Let me know and I can place that referral. Return to the  clinic or go to the nearest emergency room if any of your symptoms worsen or new symptoms occur.   I would still recommend meeting with urology to evaluate the possible abnormality on the prostate exam in March.  We have sent a referral, so hears their phone number to call and schedule an appointment.  Let me know if they need more information.  [facility]: [phone]  Take care, let me know if there are questions.     If you have lab work done today you will be contacted with your lab results within the next 2 weeks.  If you have not heard from [place] then please contact us. The fastest way to get your results is to register for My Chart.   IF you received an x-ray today, you will receive an invoice from [facility]. Please contact [facility] at [phone] with questions or concerns regarding your invoice.   IF you received labwork today, you  will receive an invoice from [place]. Please contact [facility] at [phone] with questions or concerns regarding your invoice.   Our billing staff will not be able to assist you with questions regarding bills from these companies.  You will be contacted with the lab results as soon as they are available. The fastest way to get your results is to activate your My Chart account. Instructions are located on the last page of this paperwork. If you have not heard from [place] regarding the results in 2 weeks, please contact this office.          I discussed the assessment and treatment plan with the patient. The patient was provided an opportunity to ask questions and all were answered. The patient agreed with the plan and demonstrated an understanding of the instructions.   The patient was advised to call back or seek an in-person evaluation if the symptoms worsen or if the condition fails to improve as anticipated.  I provided 10 minutes of non-face-to-face time during this encounter.  Signed,   Merri Ray, MD [facility] [place].  11/17/18

## 2018-11-17 NOTE — Progress Notes (Signed)
Spoke with pt this morning and he states he need a refill on his Meclizine medication at this time. He states he only take it as needed for his Vertigo that flairs up 4 times a year. He states there is no other concerns at this time.

## 2018-11-23 ENCOUNTER — Telehealth: Payer: Self-pay | Admitting: Family Medicine

## 2018-11-23 NOTE — Telephone Encounter (Signed)
[facility] and was told patient has been called about referral to Urology with no CB to them to schedule

## 2019-01-11 ENCOUNTER — Ambulatory Visit (INDEPENDENT_AMBULATORY_CARE_PROVIDER_SITE_OTHER): Payer: Medicare Other | Admitting: Family Medicine

## 2019-01-11 ENCOUNTER — Other Ambulatory Visit: Payer: Self-pay

## 2019-01-11 ENCOUNTER — Encounter: Payer: Self-pay | Admitting: Family Medicine

## 2019-01-11 VITALS — BP 121/77 | HR 83 | Temp 98.4°F | Resp 14 | Wt 158.2 lb

## 2019-01-11 DIAGNOSIS — N529 Male erectile dysfunction, unspecified: Secondary | ICD-10-CM

## 2019-01-11 DIAGNOSIS — N401 Enlarged prostate with lower urinary tract symptoms: Secondary | ICD-10-CM | POA: Diagnosis not present

## 2019-01-11 DIAGNOSIS — R351 Nocturia: Secondary | ICD-10-CM

## 2019-01-11 DIAGNOSIS — I10 Essential (primary) hypertension: Secondary | ICD-10-CM | POA: Diagnosis not present

## 2019-01-11 DIAGNOSIS — Z23 Encounter for immunization: Secondary | ICD-10-CM

## 2019-01-11 LAB — BASIC METABOLIC PANEL
BUN/Creatinine Ratio: 14 (ref 10–24)
BUN: 20 mg/dL (ref 8–27)
CO2: 18 mmol/L — ABNORMAL LOW (ref 20–29)
Calcium: 9.6 mg/dL (ref 8.6–10.2)
Chloride: 106 mmol/L (ref 96–106)
Creatinine, Ser: 1.41 mg/dL — ABNORMAL HIGH (ref 0.76–1.27)
GFR calc Af Amer: 59 mL/min/{1.73_m2} — ABNORMAL LOW (ref >59)
GFR calc non Af Amer: 51 mL/min/{1.73_m2} — ABNORMAL LOW (ref >59)
Glucose: 65 mg/dL (ref 65–99)
Potassium: 4.2 mmol/L (ref 3.5–5.2)
Sodium: 141 mmol/L (ref 134–144)

## 2019-01-11 MED ORDER — SILDENAFIL CITRATE 100 MG PO TABS: 50.0000 mg | ORAL_TABLET | ORAL | 3 refills | Status: DC | PRN

## 2019-01-11 MED ORDER — FINASTERIDE 5 MG PO TABS: 5.0000 mg | ORAL_TABLET | Freq: Every day | ORAL | 1 refills | Status: DC

## 2019-01-11 NOTE — Progress Notes (Signed)
Subjective:    Patient ID: Darrell Wells, male    DOB: 1951-02-13, 68 y.o.   MRN: [id number]  HPI Darrell Wells is a 68 y.o. male Presents today for: Chief Complaint  Patient presents with  . Hypertension    6 month f/u on htn- Bp has been running good  . Medication Refill    need a refill on sildenafil   Hypertension: BP Readings from Last 3 Encounters:  01/11/19 121/77  08/18/18 132/88  07/10/18 132/88   Lab Results  Component Value Date   CREATININE 1.21 07/10/2018  Lisinopril 10 mg QD.  Suspected vertigo when discussed in July.  Increase fluids discussed, monitoring at home readings.  Meclizine available if needed.only rare sx's - resolved with 1 dose meclizine.  Home readings stable - [id number] systolic.  Increased exercise lately - no CP/dyspnea.   Erectile dysfunction: Has used sildenafil 100 mg 1/2-1 as needed. Doing well with 50mg  dose. No new side effects, no HA. No vision or hearing changes, no chest pain.   BPH Doing well with Proscar QAM.   Patient Active Problem List   Diagnosis Date Noted  . Essential hypertension, benign 05/03/2014  . BPH (benign prostatic hyperplasia) 05/03/2014  . Erectile dysfunction 05/03/2014   Past Medical History:  Diagnosis Date  . Enlarged prostate   . Hypertension    Past Surgical History:  Procedure Laterality Date  . 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 REPAIR KNEE LIGAMENT Left    No Known Allergies Prior to Admission medications   Medication Sig Start Date End Date Taking? Authorizing Provider  finasteride (PROSCAR) 5 MG tablet Take 1 tablet (5 mg total) by mouth daily. 07/10/18  Yes [name], MD  lisinopril (PRINIVIL,ZESTRIL) 10 MG tablet TAKE ONE-HALF (1/2) TABLET DAILY 08/11/18  Yes [name], MD  meclizine (ANTIVERT) 25 MG tablet Take 1 tablet (25 mg total) by mouth 3 (three) times daily as needed for dizziness. 11/17/18  Yes [name], MD  sildenafil (VIAGRA) 100 MG  tablet TAKE ONE-HALF (1/2) TO ONE TABLET DAILY AS NEEDED FOR ERECTILE DYSFUNCTION 11/15/18  Yes [name], MD   Social History   Socioeconomic History  . Marital status: Single    Spouse name: Not on file  . Number of children: Not on file  . Years of education: Not on file  . Highest education level: Not on file  Occupational History  . Not on file  Social Needs  . Financial resource strain: Not on file  . Food insecurity    Worry: Not on file    Inability: Not on file  . Transportation needs    Medical: Not on file    Non-medical: Not on file  Tobacco Use  . Smoking status: Never Smoker  . Smokeless tobacco: Never Used  Substance and Sexual Activity  . Alcohol use: Yes    Comment: rare  . Drug use: No  . Sexual activity: Yes  Lifestyle  . Physical activity    Days per week: Not on file    Minutes per session: Not on file  . Stress: Not on file  Relationships  . Social Herbalist on phone: Not on file    Gets together: Not on file    Attends religious service: Not on file    Active member of club or organization: Not on file    Attends meetings of clubs or organizations: Not on file  Relationship status: Not on file  . Intimate partner violence    Fear of current or ex partner: Not on file    Emotionally abused: Not on file    Physically abused: Not on file    Forced sexual activity: Not on file  Other Topics Concern  . Not on file  Social History Narrative  . Not on file    Review of Systems  Constitutional: Negative for fatigue and unexpected weight change.  Eyes: Negative for visual disturbance.  Respiratory: Negative for cough, chest tightness and shortness of breath.   Cardiovascular: Negative for chest pain, palpitations and leg swelling.  Gastrointestinal: Negative for abdominal pain and blood in stool.  Neurological: Negative for dizziness, light-headedness and headaches.       Objective:   Physical Exam Vitals signs  reviewed.  Constitutional:      Appearance: He is well-developed.  HENT:     Head: Normocephalic and atraumatic.  Eyes:     Pupils: Pupils are equal, round, and reactive to light.  Neck:     Vascular: No carotid bruit or JVD.  Cardiovascular:     Rate and Rhythm: Normal rate and regular rhythm.     Heart sounds: Normal heart sounds. No murmur.  Pulmonary:     Effort: Pulmonary effort is normal.     Breath sounds: Normal breath sounds. No rales.  Skin:    General: Skin is warm and dry.  Neurological:     General: No focal deficit present.     Mental Status: He is alert and oriented to person, place, and time. Mental status is at baseline.    Vitals:   01/11/19 0829  BP: 121/77  Pulse: 83  Resp: 14  Temp: 98.4 F (36.9 C)  TempSrc: Oral  SpO2: 98%  Weight: 158 lb 3.2 oz (71.8 kg)       Assessment & Plan:   Syd Sancen is a 68 y.o. male Essential hypertension, benign - Plan: Basic metabolic panel  -  Stable, tolerating current regimen. Medications refilled. Labs pending as above.   Need for prophylactic vaccination and inoculation against influenza - Plan: Flu Vaccine QUAD High Dose(Fluad)  Erectile dysfunction, unspecified erectile dysfunction type - Plan: sildenafil (VIAGRA) 100 MG tablet  viagra Rx given - use lowest effective dose. Side effects discussed (including but not limited to headache/flushing, blue discoloration of vision, possible vascular steal and risk of cardiac effects if underlying unknown coronary artery disease, and permanent sensorineural hearing loss). No current side effects.   Benign prostatic hyperplasia with nocturia - Plan: finasteride (PROSCAR) 5 MG tablet  - stable. Continue same.   Meds ordered this encounter  Medications  . sildenafil (VIAGRA) 100 MG tablet    Sig: Take 0.5-1 tablets (50-100 mg total) by mouth as needed for erectile dysfunction.    Dispense:  25 tablet    Refill:  3  . finasteride (PROSCAR) 5 MG tablet    Sig:  Take 1 tablet (5 mg total) by mouth daily.    Dispense:  90 tablet    Refill:  1   Patient Instructions       If you have lab work done today you will be contacted with your lab results within the next 2 weeks.  If you have not heard from [place] then please contact us. The fastest way to get your results is to register for My Chart.   IF you received an x-ray today, you will receive an invoice from [facility].  Please contact [facility] at [phone] with questions or concerns regarding your invoice.   IF you received labwork today, you will receive an invoice from [facility]. Please contact [facility] at [phone] with questions or concerns regarding your invoice.   Our billing staff will not be able to assist you with questions regarding bills from these companies.  You will be contacted with the lab results as soon as they are available. The fastest way to get your results is to activate your My Chart account. Instructions are located on the last page of this paperwork. If you have not heard from [place] regarding the results in 2 weeks, please contact this office.       Signed,   Merri Ray, MD [facility] [place].  01/11/19 9:00 AM

## 2019-01-11 NOTE — Patient Instructions (Signed)
° ° ° °  If you have lab work done today you will be contacted with your lab results within the next 2 weeks.  If you have not heard from us then please contact us. The fastest way to get your results is to register for My Chart. ° ° °IF you received an x-ray today, you will receive an invoice from [facility]. Please contact [facility] at [phone] with questions or concerns regarding your invoice.  ° °IF you received labwork today, you will receive an invoice from [facility]. Please contact [facility] at [phone] with questions or concerns regarding your invoice.  ° °Our billing staff will not be able to assist you with questions regarding bills from these companies. ° °You will be contacted with the lab results as soon as they are available. The fastest way to get your results is to activate your My Chart account. Instructions are located on the last page of this paperwork. If you have not heard from us regarding the results in 2 weeks, please contact this office. °  ° ° ° °

## 2019-01-19 ENCOUNTER — Encounter: Payer: Self-pay | Admitting: Radiology

## 2019-02-03 ENCOUNTER — Ambulatory Visit (INDEPENDENT_AMBULATORY_CARE_PROVIDER_SITE_OTHER): Payer: Medicare Other | Admitting: Emergency Medicine

## 2019-02-03 ENCOUNTER — Encounter: Payer: Self-pay | Admitting: Emergency Medicine

## 2019-02-03 ENCOUNTER — Ambulatory Visit (INDEPENDENT_AMBULATORY_CARE_PROVIDER_SITE_OTHER): Payer: Medicare Other

## 2019-02-03 ENCOUNTER — Other Ambulatory Visit: Payer: Self-pay

## 2019-02-03 VITALS — BP 146/95 | HR 71 | Temp 98.0°F | Resp 16 | Ht 69.0 in | Wt 160.0 lb

## 2019-02-03 DIAGNOSIS — M79671 Pain in right foot: Secondary | ICD-10-CM | POA: Diagnosis not present

## 2019-02-03 DIAGNOSIS — S93601A Unspecified sprain of right foot, initial encounter: Secondary | ICD-10-CM

## 2019-02-03 DIAGNOSIS — R600 Localized edema: Secondary | ICD-10-CM | POA: Diagnosis not present

## 2019-02-03 DIAGNOSIS — M19071 Primary osteoarthritis, right ankle and foot: Secondary | ICD-10-CM | POA: Diagnosis not present

## 2019-02-03 MED ORDER — MELOXICAM 7.5 MG PO TABS: 7.5000 mg | ORAL_TABLET | Freq: Every day | ORAL | 0 refills | Status: DC

## 2019-02-03 NOTE — Progress Notes (Addendum)
Darrell Wells 68 y.o.   Chief Complaint  Patient presents with  . Foot Pain    RIGHT per patient outer area 02/02/2019 morning    HISTORY OF PRESENT ILLNESS: This is a 68 y.o. male complaining of pain to his right foot that started couple days ago.  Thinks it is because he has been working more in the backyard lately.  No other significant symptoms.  Denies direct injury to foot or ankle.  HPI   Prior to Admission medications   Medication Sig Start Date End Date Taking? Authorizing Provider  finasteride (PROSCAR) 5 MG tablet Take 1 tablet (5 mg total) by mouth daily. 01/11/19  Yes [name], MD  lisinopril (PRINIVIL,ZESTRIL) 10 MG tablet TAKE ONE-HALF (1/2) TABLET DAILY 08/11/18  Yes [name], MD  meclizine (ANTIVERT) 25 MG tablet Take 1 tablet (25 mg total) by mouth 3 (three) times daily as needed for dizziness. 11/17/18  Yes [name], MD  sildenafil (VIAGRA) 100 MG tablet Take 0.5-1 tablets (50-100 mg total) by mouth as needed for erectile dysfunction. 01/11/19  Yes [name], MD    No Known Allergies  Patient Active Problem List   Diagnosis Date Noted  . Essential hypertension, benign 05/03/2014  . BPH (benign prostatic hyperplasia) 05/03/2014  . Erectile dysfunction 05/03/2014    Past Medical History:  Diagnosis Date  . Enlarged prostate   . Hypertension     Past Surgical History:  Procedure Laterality Date  . 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 REPAIR KNEE LIGAMENT Left     Social History   Socioeconomic History  . Marital status: Single    Spouse name: Not on file  . Number of children: Not on file  . Years of education: Not on file  . Highest education level: Not on file  Occupational History  . Not on file  Social Needs  . Financial resource strain: Not on file  . Food insecurity    Worry: Not on file    Inability: Not on file  . Transportation needs    Medical: Not on file    Non-medical: Not on  file  Tobacco Use  . Smoking status: Never Smoker  . Smokeless tobacco: Never Used  Substance and Sexual Activity  . Alcohol use: Yes    Comment: rare  . Drug use: No  . Sexual activity: Yes  Lifestyle  . Physical activity    Days per week: Not on file    Minutes per session: Not on file  . Stress: Not on file  Relationships  . Social Herbalist on phone: Not on file    Gets together: Not on file    Attends religious service: Not on file    Active member of club or organization: Not on file    Attends meetings of clubs or organizations: Not on file    Relationship status: Not on file  . Intimate partner violence    Fear of current or ex partner: Not on file    Emotionally abused: Not on file    Physically abused: Not on file    Forced sexual activity: Not on file  Other Topics Concern  . Not on file  Social History Narrative  . Not on file    Family History  Problem Relation Age of Onset  . Colon cancer Neg Hx   . Esophageal cancer Neg Hx   . Rectal cancer Neg Hx   .  Stomach cancer Neg Hx      Review of Systems  Constitutional: Negative.  Negative for chills and fever.  HENT: Negative.  Negative for congestion and sore throat.   Respiratory: Negative.  Negative for cough and shortness of breath.   Cardiovascular: Negative.  Negative for chest pain and palpitations.  Gastrointestinal: Negative.  Negative for abdominal pain, diarrhea, nausea and vomiting.  Musculoskeletal:       Right foot pain  Skin: Negative.  Negative for rash.  Neurological: Negative.  Negative for dizziness and headaches.  All other systems reviewed and are negative.  Vitals:   02/03/19 1117  BP: (!) 146/95  Pulse: 71  Resp: 16  Temp: 98 F (36.7 C)  SpO2: 100%     Physical Exam Vitals signs reviewed.  Constitutional:      Appearance: Normal appearance.  HENT:     Head: Normocephalic.  Eyes:     Extraocular Movements: Extraocular movements intact.  Neck:      Musculoskeletal: Normal range of motion.  Cardiovascular:     Rate and Rhythm: Normal rate.  Pulmonary:     Effort: Pulmonary effort is normal.  Musculoskeletal:     Comments: Right foot: Positive swelling and tenderness to proximal lateral aspect of foot.  Full range of motion.  Neurovascularly intact.  No erythema or ecchymosis. Right ankle: Within normal limits.  Skin:    General: Skin is warm and dry.     Capillary Refill: Capillary refill takes less than 2 seconds.  Neurological:     General: No focal deficit present.     Mental Status: He is alert and oriented to person, place, and time.  Psychiatric:        Mood and Affect: Mood normal.        Behavior: Behavior normal.    Dg Foot Complete Right  Result Date: 02/03/2019 CLINICAL DATA:  Right foot pain EXAM: RIGHT FOOT COMPLETE - 3+ VIEW COMPARISON:  None. FINDINGS: No fracture or dislocation of the right foot. Mild first metatarsophalangeal arthrosis. Joint spaces are otherwise well preserved. Diffuse soft tissue edema about the foot, particularly laterally. IMPRESSION: 1. No fracture or dislocation of the right foot. 2. Mild first metatarsophalangeal arthrosis. Joint spaces are otherwise well preserved. 3. Diffuse soft tissue edema about the foot, particularly laterally. Electronically Signed   By: Eddie Candle M.D.   On: 02/03/2019 12:03     ASSESSMENT & PLAN: Ludwig was seen today for foot pain.  Diagnoses and all orders for this visit:  Right foot pain -     DG Foot Complete Right; Future -     meloxicam (MOBIC) 7.5 MG tablet; Take 1 tablet (7.5 mg total) by mouth daily for 10 days. -     Ambulatory referral to Podiatry  Foot sprain, right, initial encounter    Patient Instructions       If you have lab work done today you will be contacted with your lab results within the next 2 weeks.  If you have not heard from [place] then please contact us. The fastest way to get your results is to register for My Chart.    IF you received an x-ray today, you will receive an invoice from [facility]. Please contact [facility] at [phone] with questions or concerns regarding your invoice.   IF you received labwork today, you will receive an invoice from [facility]. Please contact [facility] at [phone] with questions or concerns regarding your invoice.   Our billing staff  will not be able to assist you with questions regarding bills from these companies.  You will be contacted with the lab results as soon as they are available. The fastest way to get your results is to activate your My Chart account. Instructions are located on the last page of this paperwork. If you have not heard from [place] regarding the results in 2 weeks, please contact this office.     Foot Pain Many things can cause foot pain. Some common causes are:  An injury.  A sprain.  Arthritis.  Blisters.  Bunions. Follow these instructions at home: Managing pain, stiffness, and swelling If directed, put ice on the painful area:  Put ice in a plastic bag.  Place a towel between your skin and the bag.  Leave the ice on for 20 minutes, 2-3 times a day.  Activity  Do not stand or walk for long periods.  Return to your normal activities as told by your health care provider. Ask your health care provider what activities are safe for you.  Do stretches to relieve foot pain and stiffness as told by your health care provider.  Do not lift anything that is heavier than 10 lb (4.5 kg), or the limit that you are told, until your health care provider says that it is safe. Lifting a lot of weight can put added pressure on your feet. Lifestyle  Wear comfortable, supportive shoes that fit you well. Do not wear high heels.  Keep your feet clean and dry. General instructions  Take over-the-counter and prescription medicines only as told by your health care provider.  Rub your foot gently.  Pay attention to any changes  in your symptoms.  Keep all follow-up visits as told by your health care provider. This is important. Contact a health care provider if:  Your pain does not get better after a few days of self-care.  Your pain gets worse.  You cannot stand on your foot. Get help right away if:  Your foot is numb or tingling.  Your foot or toes are swollen.  Your foot or toes turn white or blue.  You have warmth and redness along your foot. Summary  Common causes of foot pain are injury, sprain, arthritis, blisters or bunions.  Ice, medicines, and comfortable shoes may help foot pain.  Contact your health care provider if your pain does not get better after a few days of self-care. This information is not intended to replace advice given to you by your health care provider. Make sure you discuss any questions you have with your health care provider. Document Released: 05/12/2015 Document Revised: 01/29/2018 Document Reviewed: 01/29/2018 Elsevier Patient Education  2020 Elsevier Inc.      Agustina Caroli, MD [facility]

## 2019-02-03 NOTE — Patient Instructions (Addendum)
     If you have lab work done today you will be contacted with your lab results within the next 2 weeks.  If you have not heard from us then please contact us. The fastest way to get your results is to register for My Chart.   IF you received an x-ray today, you will receive an invoice from [facility]. Please contact  Radiology at [phone] with questions or concerns regarding your invoice.   IF you received labwork today, you will receive an invoice from [facility]. Please contact [facility] at [phone] with questions or concerns regarding your invoice.   Our billing staff will not be able to assist you with questions regarding bills from these companies.  You will be contacted with the lab results as soon as they are available. The fastest way to get your results is to activate your My Chart account. Instructions are located on the last page of this paperwork. If you have not heard from us regarding the results in 2 weeks, please contact this office.      Foot Pain Many things can cause foot pain. Some common causes are:  An injury.  A sprain.  Arthritis.  Blisters.  Bunions. Follow these instructions at home: Managing pain, stiffness, and swelling If directed, put ice on the painful area:  Put ice in a plastic bag.  Place a towel between your skin and the bag.  Leave the ice on for 20 minutes, 2-3 times a day.  Activity  Do not stand or walk for long periods.  Return to your normal activities as told by your health care provider. Ask your health care provider what activities are safe for you.  Do stretches to relieve foot pain and stiffness as told by your health care provider.  Do not lift anything that is heavier than 10 lb (4.5 kg), or the limit that you are told, until your health care provider says that it is safe. Lifting a lot of weight can put added pressure on your feet. Lifestyle  Wear comfortable, supportive shoes that fit you  well. Do not wear high heels.  Keep your feet clean and dry. General instructions  Take over-the-counter and prescription medicines only as told by your health care provider.  Rub your foot gently.  Pay attention to any changes in your symptoms.  Keep all follow-up visits as told by your health care provider. This is important. Contact a health care provider if:  Your pain does not get better after a few days of self-care.  Your pain gets worse.  You cannot stand on your foot. Get help right away if:  Your foot is numb or tingling.  Your foot or toes are swollen.  Your foot or toes turn white or blue.  You have warmth and redness along your foot. Summary  Common causes of foot pain are injury, sprain, arthritis, blisters or bunions.  Ice, medicines, and comfortable shoes may help foot pain.  Contact your health care provider if your pain does not get better after a few days of self-care. This information is not intended to replace advice given to you by your health care provider. Make sure you discuss any questions you have with your health care provider. Document Released: 05/12/2015 Document Revised: 01/29/2018 Document Reviewed: 01/29/2018 Elsevier Patient Education  2020 Elsevier Inc.  

## 2019-02-08 ENCOUNTER — Ambulatory Visit: Payer: Medicare Other | Admitting: Podiatry

## 2019-02-10 ENCOUNTER — Other Ambulatory Visit: Payer: Self-pay | Admitting: Emergency Medicine

## 2019-02-10 DIAGNOSIS — M79671 Pain in right foot: Secondary | ICD-10-CM

## 2019-02-10 NOTE — Telephone Encounter (Signed)
Requested medication (s) are due for refill today: yes  Requested medication (s) are on the active medication list: yes  Last refill:  02/03/2019  Future visit scheduled: yes  Notes to clinic:  Review for refill   Requested Prescriptions  Pending Prescriptions Disp Refills   meloxicam (MOBIC) 7.5 MG tablet [Pharmacy Med Name: MELOXICAM 7.5MG  TABLETS] 10 tablet 0    Sig: TAKE 1 TABLET(7.5 MG) BY MOUTH DAILY FOR 10 DAYS     Analgesics:  COX2 Inhibitors Failed - 02/10/2019  3:46 AM      Failed - HGB in normal range and within 360 days    Hemoglobin  Date Value Ref Range Status  11/24/2017 13.6 (A) 14.1 - 18.1 g/dL Final  05/09/2015 13.4 13.0 - 17.0 g/dL Final         Failed - Cr in normal range and within 360 days    Creat  Date Value Ref Range Status  05/09/2015 1.33 (H) 0.70 - 1.25 mg/dL Final   Creatinine, Ser  Date Value Ref Range Status  01/11/2019 1.41 (H) 0.76 - 1.27 mg/dL Final         Passed - Patient is not pregnant      Passed - Valid encounter within last 12 months    Recent Outpatient Visits          1 week ago Right foot pain   [facility], Ines Bloomer, MD   1 month ago Essential hypertension, benign   [facility], Ranell Patrick, MD   2 months ago Vertigo   [facility] [place], MD   5 months ago Medicare annual wellness visit, subsequent   [facility], Ranell Patrick, MD   7 months ago Wellness examination   [facility], Ranell Patrick, MD      Future Appointments            In 5 months [name] Ranell Patrick, MD [facility] [place], [facility]

## 2019-02-11 ENCOUNTER — Ambulatory Visit (INDEPENDENT_AMBULATORY_CARE_PROVIDER_SITE_OTHER): Payer: Medicare Other

## 2019-02-11 ENCOUNTER — Encounter: Payer: Self-pay | Admitting: Podiatry

## 2019-02-11 ENCOUNTER — Other Ambulatory Visit: Payer: Self-pay

## 2019-02-11 ENCOUNTER — Ambulatory Visit: Payer: Medicare Other

## 2019-02-11 ENCOUNTER — Ambulatory Visit (INDEPENDENT_AMBULATORY_CARE_PROVIDER_SITE_OTHER): Payer: Medicare Other | Admitting: Podiatry

## 2019-02-11 VITALS — Resp 16

## 2019-02-11 DIAGNOSIS — M7671 Peroneal tendinitis, right leg: Secondary | ICD-10-CM | POA: Diagnosis not present

## 2019-02-11 DIAGNOSIS — M722 Plantar fascial fibromatosis: Secondary | ICD-10-CM

## 2019-02-11 DIAGNOSIS — M7661 Achilles tendinitis, right leg: Secondary | ICD-10-CM

## 2019-02-11 MED ORDER — DICLOFENAC SODIUM 75 MG PO TBEC: 75.0000 mg | DELAYED_RELEASE_TABLET | Freq: Two times a day (BID) | ORAL | 2 refills | Status: DC

## 2019-02-11 NOTE — Patient Instructions (Signed)

## 2019-02-11 NOTE — Progress Notes (Signed)
Subjective:   Patient ID: Darrell Wells, male   DOB: 68 y.o.   MRN: [id number]   HPI Patient states that he has pain on the outside of his right foot that radiates up into the ankle does not remember injuries.  States it has been hurting for the last week the worse and also patient does like to run 6 miles 4 times a week and has not been able to do that.  Patient has good distal perfusion well oriented x3 and does not smoke likes to be active   Review of Systems  All other systems reviewed and are negative.       Objective:  Physical Exam Vitals signs and nursing note reviewed.  Constitutional:      Appearance: He is well-developed.  Pulmonary:     Effort: Pulmonary effort is normal.  Musculoskeletal: Normal range of motion.  Skin:    General: Skin is warm.  Neurological:     Mental Status: He is alert.     Neurovascular status found to be intact muscle strength was noted to be immediate.  Range of motion satisfactory subtalar joint midtarsal joint with no muscle strength loss and patient is noted to have quite a bit of discomfort in the plantar lateral aspect of the right heel extending into the peroneal complex which appears to be compensatory.  Patient has good digital perfusion noted      Assessment:  Probable lateral band plantar fasciitis right with inflammation with compensatory peroneal tendinitis     Plan:  H&P condition reviewed and did plantar lateral injection of the fascia 3 mg Kenalog 5 mg Xylocaine applied fascial brace to lift up the lateral side of the foot take stress of the peroneal tendon placed on diclofenac and ice and reappoint again in 3 weeks or earlier if needed  X-rays were negative for signs of fracture and did not indicate current spur formation

## 2019-02-11 NOTE — Progress Notes (Signed)
   Subjective:    Patient ID: Darrell Wells, male    DOB: 12-23-1950, 68 y.o.   MRN: [id number]  HPI    Review of Systems  All other systems reviewed and are negative.      Objective:   Physical Exam        Assessment & Plan:

## 2019-02-18 ENCOUNTER — Other Ambulatory Visit: Payer: Self-pay | Admitting: *Deleted

## 2019-02-18 ENCOUNTER — Other Ambulatory Visit: Payer: Self-pay | Admitting: Emergency Medicine

## 2019-02-18 DIAGNOSIS — M79671 Pain in right foot: Secondary | ICD-10-CM

## 2019-02-18 NOTE — Telephone Encounter (Signed)
Forwarding medication refill request to the clinical pool for review. 

## 2019-03-03 ENCOUNTER — Other Ambulatory Visit: Payer: Self-pay | Admitting: Emergency Medicine

## 2019-03-03 DIAGNOSIS — M79671 Pain in right foot: Secondary | ICD-10-CM

## 2019-03-03 NOTE — Telephone Encounter (Signed)
Requested medication (s) are due for refill today: yes  Requested medication (s) are on the active medication list: yes  Last refill:  02/18/2019  Future visit scheduled: yes  Notes to clinic:  Review for refill   Requested Prescriptions  Pending Prescriptions Disp Refills   meloxicam (MOBIC) 7.5 MG tablet [Pharmacy Med Name: MELOXICAM 7.5MG  TABLETS] 10 tablet 0    Sig: TAKE 1 TABLET(7.5 MG) BY MOUTH DAILY FOR 10 DAYS     Analgesics:  COX2 Inhibitors Failed - 03/03/2019  3:47 AM      Failed - HGB in normal range and within 360 days    Hemoglobin  Date Value Ref Range Status  11/24/2017 13.6 (A) 14.1 - 18.1 g/dL Final  05/09/2015 13.4 13.0 - 17.0 g/dL Final         Failed - Cr in normal range and within 360 days    Creat  Date Value Ref Range Status  05/09/2015 1.33 (H) 0.70 - 1.25 mg/dL Final   Creatinine, Ser  Date Value Ref Range Status  01/11/2019 1.41 (H) 0.76 - 1.27 mg/dL Final         Passed - Patient is not pregnant      Passed - Valid encounter within last 12 months    Recent Outpatient Visits          4 weeks ago Right foot pain   Primary [facility] at [facility], Ines Bloomer, MD   1 month ago Essential hypertension, benign   Primary Care at [facility], Ranell Patrick, MD   3 months ago Vertigo   Primary Care at [place], MD   6 months ago Medicare annual wellness visit, subsequent   Primary [facility] at [facility], Ranell Patrick, MD   7 months ago Wellness examination   Primary Care at [facility], Ranell Patrick, MD      Future Appointments            In 4 months [name] Ranell Patrick, MD Primary Care at [place], [facility]

## 2019-03-04 ENCOUNTER — Encounter: Payer: Self-pay | Admitting: Podiatry

## 2019-03-04 ENCOUNTER — Other Ambulatory Visit: Payer: Self-pay

## 2019-03-04 ENCOUNTER — Ambulatory Visit (INDEPENDENT_AMBULATORY_CARE_PROVIDER_SITE_OTHER): Payer: Medicare Other | Admitting: Podiatry

## 2019-03-04 DIAGNOSIS — B351 Tinea unguium: Secondary | ICD-10-CM

## 2019-03-04 DIAGNOSIS — M722 Plantar fascial fibromatosis: Secondary | ICD-10-CM

## 2019-03-04 NOTE — Progress Notes (Signed)
Subjective:   Patient ID: Darrell Wells, male   DOB: 68 y.o.   MRN: [id number]   HPI Patient states he was feeling quite a bit better stating that he is just starting to run again and he states he is having quite a bit of diminishment of discomfort   ROS      Objective:  Physical Exam  Neurovascular status intact with patient's plantar right heel improved with mild pain only upon deep palpation and able to walk with better heel toe gait also diminishment of nail disease     Assessment:  Improving with plantar fascial symptomatology with moderate depression of the arch and quite active individual with mild mycotic infection     Plan:  H&P reviewed condition and recommended the continuation of anti-inflammatory physical therapy and supportive shoes and dispensed power step insoles with possibility for custom orthotics and gave him a program to increase his running after a lengthy discussion.  Do not recommend any treatment for mild thickness of toenails

## 2019-04-28 ENCOUNTER — Other Ambulatory Visit: Payer: Self-pay | Admitting: Podiatry

## 2019-06-28 ENCOUNTER — Ambulatory Visit: Payer: Medicare Other | Attending: Internal Medicine

## 2019-06-28 ENCOUNTER — Other Ambulatory Visit: Payer: Self-pay

## 2019-06-28 DIAGNOSIS — Z23 Encounter for immunization: Secondary | ICD-10-CM | POA: Insufficient documentation

## 2019-06-28 NOTE — Progress Notes (Signed)
   Covid-19 Vaccination Clinic  Name:  Tyonne Puertas    MRN: [id number] DOB: Nov 30, 1950  06/28/2019  Mr. Aven was observed post Covid-19 immunization for 15 minutes without incidence. He was provided with Vaccine Information Sheet and instruction to access the V-Safe system.   Mr. Stetter was instructed to call 911 with any severe reactions post vaccine: [name] Kitchen Difficulty breathing  . Swelling of your face and throat  . A fast heartbeat  . A bad rash all over your body  . Dizziness and weakness    Immunizations Administered    Name Date Dose VIS Date Route   Pfizer COVID-19 Vaccine 06/28/2019 11:14 AM 0.3 mL 04/09/2019 Intramuscular   Manufacturer: [place]   Lot: [id number]   [place]: [id number]

## 2019-07-10 ENCOUNTER — Other Ambulatory Visit: Payer: Self-pay | Admitting: Family Medicine

## 2019-07-10 DIAGNOSIS — N401 Enlarged prostate with lower urinary tract symptoms: Secondary | ICD-10-CM

## 2019-07-10 NOTE — Telephone Encounter (Signed)
Requested Prescriptions  Pending Prescriptions Disp Refills  . finasteride (PROSCAR) 5 MG tablet [Pharmacy Med Name: FINASTERIDE 5MG  TABLETS] 90 tablet 1    Sig: TAKE 1 TABLET(5 MG) BY MOUTH DAILY     Urology: 5-alpha Reductase Inhibitors Passed - 07/10/2019 10:39 AM      Passed - Valid encounter within last 12 months    Recent Outpatient Visits          5 months ago Right foot pain   Primary Care at [facility], Ines Bloomer, MD   6 months ago Essential hypertension, benign   Primary Care at [facility], Ranell Patrick, MD   7 months ago Vertigo   [facility] Care at [facility], Ranell Patrick, MD   10 months ago Medicare annual wellness visit, subsequent   Primary Care at [facility], Ranell Patrick, MD   1 year ago Wellness examination   Primary Care at [place], MD      Future Appointments            In 2 days [name], MD Primary Care at [place], [facility]

## 2019-07-12 ENCOUNTER — Other Ambulatory Visit: Payer: Self-pay

## 2019-07-12 ENCOUNTER — Ambulatory Visit (INDEPENDENT_AMBULATORY_CARE_PROVIDER_SITE_OTHER): Payer: Medicare Other | Admitting: Family Medicine

## 2019-07-12 ENCOUNTER — Encounter: Payer: Self-pay | Admitting: Family Medicine

## 2019-07-12 VITALS — BP 143/94 | HR 84 | Temp 97.8°F | Resp 15 | Ht 69.0 in | Wt 166.0 lb

## 2019-07-12 DIAGNOSIS — R351 Nocturia: Secondary | ICD-10-CM | POA: Diagnosis not present

## 2019-07-12 DIAGNOSIS — I10 Essential (primary) hypertension: Secondary | ICD-10-CM

## 2019-07-12 DIAGNOSIS — N529 Male erectile dysfunction, unspecified: Secondary | ICD-10-CM

## 2019-07-12 DIAGNOSIS — N182 Chronic kidney disease, stage 2 (mild): Secondary | ICD-10-CM | POA: Diagnosis not present

## 2019-07-12 DIAGNOSIS — N401 Enlarged prostate with lower urinary tract symptoms: Secondary | ICD-10-CM | POA: Diagnosis not present

## 2019-07-12 LAB — BASIC METABOLIC PANEL
BUN/Creatinine Ratio: 12 (ref 10–24)
BUN: 16 mg/dL (ref 8–27)
CO2: 22 mmol/L (ref 20–29)
Calcium: 9.1 mg/dL (ref 8.6–10.2)
Chloride: 107 mmol/L — ABNORMAL HIGH (ref 96–106)
Creatinine, Ser: 1.29 mg/dL — ABNORMAL HIGH (ref 0.76–1.27)
GFR calc Af Amer: 65 mL/min/{1.73_m2} (ref >59)
GFR calc non Af Amer: 57 mL/min/{1.73_m2} — ABNORMAL LOW (ref >59)
Glucose: 89 mg/dL (ref 65–99)
Potassium: 4 mmol/L (ref 3.5–5.2)
Sodium: 140 mmol/L (ref 134–144)

## 2019-07-12 MED ORDER — SILDENAFIL CITRATE 100 MG PO TABS: 50.0000 mg | ORAL_TABLET | ORAL | 3 refills | Status: DC | PRN

## 2019-07-12 MED ORDER — LISINOPRIL 10 MG PO TABS: 5.0000 mg | ORAL_TABLET | Freq: Every day | ORAL | 3 refills | Status: DC

## 2019-07-12 MED ORDER — FINASTERIDE 5 MG PO TABS: ORAL_TABLET | ORAL | 1 refills | Status: DC

## 2019-07-12 NOTE — Progress Notes (Signed)
Subjective:  Patient ID: Darrell Wells, male    DOB: 1950/06/22  Age: 69 y.o. MRN: [id number]  CC:  Chief Complaint  Patient presents with  . Medicare Wellness    pt has no concerns, feels well today    HPI Darrell Wells presents for  Initially presented for annual exam, but early.  Hypertension: Stable in September 2020.  Only taking lisinopril 5mg  (1/2 of the 10mg ) few times per week.  No new side effects.  Denies missed doses. Active with grandson in town.  Home readings: 120/80's.  CKD - borderline Cr 1.31 in 12/2018. Range 1.21-1.45 since 2019.  BP Readings from Last 3 Encounters:  07/12/19 (!) 143/94  02/03/19 (!) 146/95  01/11/19 121/77   Lab Results  Component Value Date   CREATININE 1.41 (H) 01/11/2019   BPH with nocturia Treated with finasteride 5 mg daily.  Stable last visit. Nocturia 1-2 per night. Stable.   Erectile dysfunction Treated with Viagra previously.  Lowest effective dosing discussed. Takes 50mg  dose. No new side effects. No change in vision, hearing or Cp with exertion.   Health maintenance: Due for Tdap Colonoscopy last year PSA 0.5 on July 10, 2018.  No FH of prostate CA. Declines testing at present after r/b discussion.   Immunization History  Administered Date(s) Administered  . Fluad Quad(high Dose 65+) 01/11/2019  . Influenza, High Dose Seasonal PF 07/10/2018  . Influenza,inj,Quad PF,6+ Mos 05/03/2014, 05/09/2015, 04/18/2016, 05/26/2017  . PFIZER SARS-COV-2 Vaccination 06/28/2019  . Pneumococcal Conjugate-13 05/23/2016  . Pneumococcal Polysaccharide-23 05/26/2017  Shingles vaccine:  Rx last year. Not sure he received shingrix.  Status post first COVID-19 vaccination.   History Patient Active Problem List   Diagnosis Date Noted  . Essential hypertension, benign 05/03/2014  . BPH (benign prostatic hyperplasia) 05/03/2014  . Erectile dysfunction 05/03/2014   Past Medical History:  Diagnosis Date  . Enlarged prostate   .  Hypertension    Past Surgical History:  Procedure Laterality Date  . 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 REPAIR KNEE LIGAMENT Left    No Known Allergies Prior to Admission medications   Medication Sig Start Date End Date Taking? Authorizing Provider  diclofenac (VOLTAREN) 75 MG EC tablet TAKE 1 TABLET(75 MG) BY MOUTH TWICE DAILY 04/28/19  Yes Wallene Huh, DPM  finasteride (PROSCAR) 5 MG tablet TAKE 1 TABLET(5 MG) BY MOUTH DAILY 07/10/19  Yes [name], MD  lisinopril (PRINIVIL,ZESTRIL) 10 MG tablet TAKE ONE-HALF (1/2) TABLET DAILY 08/11/18  Yes [name], MD  meclizine (ANTIVERT) 25 MG tablet Take 1 tablet (25 mg total) by mouth 3 (three) times daily as needed for dizziness. 11/17/18  Yes [name], MD  sildenafil (VIAGRA) 100 MG tablet Take 0.5-1 tablets (50-100 mg total) by mouth as needed for erectile dysfunction. 01/11/19  Yes [name], MD  meloxicam (MOBIC) 7.5 MG tablet TAKE 1 TABLET(7.5 MG) BY MOUTH DAILY FOR 10 DAYS Patient not taking: Reported on 07/12/2019 02/18/19   Horald Pollen, MD   Social History   Socioeconomic History  . Marital status: Single    Spouse name: Not on file  . Number of children: Not on file  . Years of education: Not on file  . Highest education level: Not on file  Occupational History  . Not on file  Tobacco Use  . Smoking status: Never Smoker  . Smokeless tobacco: Never Used  Substance and Sexual Activity  . Alcohol use: Yes  Comment: rare  . Drug use: No  . Sexual activity: Yes  Other Topics Concern  . Not on file  Social History Narrative  . Not on file   Social Determinants of Health   Financial Resource Strain:   . Difficulty of Paying Living Expenses:   Food Insecurity:   . Worried About Charity fundraiser in the Last Year:   . Arboriculturist in the Last Year:   Transportation Needs:   . Film/video editor (Medical):   [name] Kitchen Lack of Transportation (Non-Medical):    Physical Activity:   . Days of Exercise per Week:   . Minutes of Exercise per Session:   Stress:   . Feeling of Stress :   Social Connections:   . Frequency of Communication with Friends and Family:   . Frequency of Social Gatherings with Friends and Family:   . Attends Religious Services:   . Active Member of Clubs or Organizations:   . Attends Archivist Meetings:   [name] Kitchen Marital Status:   Intimate Partner Violence:   . Fear of Current or Ex-Partner:   . Emotionally Abused:   [name] Kitchen Physically Abused:   . Sexually Abused:     Review of Systems  Constitutional: Negative for fatigue and unexpected weight change.  Eyes: Negative for visual disturbance.  Respiratory: Negative for cough, chest tightness and shortness of breath.   Cardiovascular: Negative for chest pain, palpitations and leg swelling.  Gastrointestinal: Negative for abdominal pain and blood in stool.  Neurological: Negative for dizziness, light-headedness and headaches.     Objective:   Vitals:   07/12/19 0803  BP: (!) 143/94  Pulse: 84  Resp: 15  Temp: 97.8 F (36.6 C)  TempSrc: Temporal  SpO2: 100%  Weight: 166 lb (75.3 kg)  Height: 5\' 9"  (1.753 m)     Physical Exam Vitals reviewed.  Constitutional:      Appearance: He is well-developed.  HENT:     Head: Normocephalic and atraumatic.  Eyes:     Pupils: Pupils are equal, round, and reactive to light.  Neck:     Vascular: No carotid bruit or JVD.  Cardiovascular:     Rate and Rhythm: Normal rate and regular rhythm.     Heart sounds: Normal heart sounds. No murmur.  Pulmonary:     Effort: Pulmonary effort is normal.     Breath sounds: Normal breath sounds. No rales.  Skin:    General: Skin is warm and dry.  Neurological:     Mental Status: He is alert and oriented to person, place, and time.        Assessment & Plan:  Darrell Wells is a 69 y.o. male . Essential hypertension - Plan: Basic metabolic panel CKD (chronic kidney  disease) stage 2, GFR 60-89 ml/min - Plan: Basic metabolic panel Essential hypertension, benign - Plan: lisinopril (ZESTRIL) 10 MG tablet   -Restart lisinopril 5 mg daily.  Monitor home readings.  Portance of blood pressure control discussed with CKD.  Labile between stage 3a, stage 2.  Recheck 6 weeks at [facility] wellness exam.  Benign prostatic hyperplasia with nocturia - Plan: finasteride (PROSCAR) 5 MG tablet  -Stable with finasteride, continue same.  Erectile dysfunction, unspecified erectile dysfunction type - Plan: sildenafil (VIAGRA) 100 MG tablet  -viagra Rx given - use lowest effective dose. Side effects discussed (including but not limited to headache/flushing, blue discoloration of vision, possible vascular steal and risk of cardiac effects if underlying unknown coronary  artery disease, and permanent sensorineural hearing loss). Understanding expressed.   Meds ordered this encounter  Medications  . finasteride (PROSCAR) 5 MG tablet    Sig: TAKE 1 TABLET(5 MG) BY MOUTH DAILY    Dispense:  90 tablet    Refill:  1  . lisinopril (ZESTRIL) 10 MG tablet    Sig: Take 0.5 tablets (5 mg total) by mouth daily.    Dispense:  45 tablet    Refill:  3  . sildenafil (VIAGRA) 100 MG tablet    Sig: Take 0.5-1 tablets (50-100 mg total) by mouth as needed for erectile dysfunction.    Dispense:  25 tablet    Refill:  3   Patient Instructions    Restart lisinopril 1/2 pill daily, Keep a record of your blood pressures outside of the office.  If new side effects at daily dosing of blood pressure medication, let me know.  Follow-up in 6 weeks for recheck blood pressure, Medicare wellness exam at that time.  Thanks for coming in today.   If you have lab work done today you will be contacted with your lab results within the next 2 weeks.  If you have not heard from [place] then please contact us. The fastest way to get your results is to register for My Chart.   IF you received an x-ray today, you  will receive an invoice from [facility]. Please contact [facility] at [phone] with questions or concerns regarding your invoice.   IF you received labwork today, you will receive an invoice from [place]. Please contact [facility] at [phone] with questions or concerns regarding your invoice.   Our billing staff will not be able to assist you with questions regarding bills from these companies.  You will be contacted with the lab results as soon as they are available. The fastest way to get your results is to activate your My Chart account. Instructions are located on the last page of this paperwork. If you have not heard from [place] regarding the results in 2 weeks, please contact this office.         Signed, Merri Ray, MD [facility]

## 2019-07-12 NOTE — Patient Instructions (Addendum)
  Restart lisinopril 1/2 pill daily, Keep a record of your blood pressures outside of the office.  If new side effects at daily dosing of blood pressure medication, let me know.  Follow-up in 6 weeks for recheck blood pressure, Medicare wellness exam at that time.  Thanks for coming in today.   If you have lab work done today you will be contacted with your lab results within the next 2 weeks.  If you have not heard from [place] then please contact us. The fastest way to get your results is to register for My Chart.   IF you received an x-ray today, you will receive an invoice from [facility]. Please contact [facility] at [phone] with questions or concerns regarding your invoice.   IF you received labwork today, you will receive an invoice from [place]. Please contact [facility] at [phone] with questions or concerns regarding your invoice.   Our billing staff will not be able to assist you with questions regarding bills from these companies.  You will be contacted with the lab results as soon as they are available. The fastest way to get your results is to activate your My Chart account. Instructions are located on the last page of this paperwork. If you have not heard from [place] regarding the results in 2 weeks, please contact this office.

## 2019-07-13 ENCOUNTER — Encounter: Payer: Medicare Other | Admitting: Family Medicine

## 2019-07-14 ENCOUNTER — Other Ambulatory Visit: Payer: Self-pay | Admitting: Podiatry

## 2019-07-21 ENCOUNTER — Ambulatory Visit: Payer: Medicare Other | Attending: Internal Medicine

## 2019-07-21 DIAGNOSIS — Z23 Encounter for immunization: Secondary | ICD-10-CM

## 2019-07-21 NOTE — Progress Notes (Signed)
   Covid-19 Vaccination Clinic  Name:  Darrell Wells    MRN: [id number] DOB: 1951/01/12  07/21/2019  [name] was observed post Covid-19 immunization for 15 minutes without incident. He was provided with Vaccine Information Sheet and instruction to access the V-Safe system.   [name] was instructed to call 911 with any severe reactions post vaccine: [name] Kitchen Difficulty breathing  . Swelling of face and throat  . A fast heartbeat  . A bad rash all over body  . Dizziness and weakness   Immunizations Administered    Name Date Dose VIS Date Route   Pfizer COVID-19 Vaccine 07/21/2019 10:18 AM 0.3 mL 04/09/2019 Intramuscular   Manufacturer: [place]   Lot: [id number]   [place]: [id number]

## 2019-08-08 ENCOUNTER — Other Ambulatory Visit: Payer: Self-pay | Admitting: Podiatry

## 2019-08-10 ENCOUNTER — Other Ambulatory Visit: Payer: Self-pay | Admitting: Family Medicine

## 2019-08-10 DIAGNOSIS — I10 Essential (primary) hypertension: Secondary | ICD-10-CM

## 2019-08-20 ENCOUNTER — Encounter: Payer: Medicare Other | Admitting: Family Medicine

## 2019-08-23 ENCOUNTER — Other Ambulatory Visit: Payer: Self-pay

## 2019-08-23 ENCOUNTER — Encounter: Payer: Self-pay | Admitting: Family Medicine

## 2019-08-23 ENCOUNTER — Ambulatory Visit (INDEPENDENT_AMBULATORY_CARE_PROVIDER_SITE_OTHER): Payer: Medicare Other | Admitting: Family Medicine

## 2019-08-23 VITALS — BP 137/97 | HR 73 | Temp 98.7°F | Ht 69.0 in | Wt 165.0 lb

## 2019-08-23 DIAGNOSIS — R42 Dizziness and giddiness: Secondary | ICD-10-CM | POA: Diagnosis not present

## 2019-08-23 DIAGNOSIS — Z0000 Encounter for general adult medical examination without abnormal findings: Secondary | ICD-10-CM

## 2019-08-23 DIAGNOSIS — I10 Essential (primary) hypertension: Secondary | ICD-10-CM | POA: Diagnosis not present

## 2019-08-23 MED ORDER — MECLIZINE HCL 25 MG PO TABS: 25.0000 mg | ORAL_TABLET | Freq: Three times a day (TID) | ORAL | 2 refills | Status: DC | PRN

## 2019-08-23 NOTE — Patient Instructions (Addendum)
No med changes today.  Nurse visit with home monitor to check accuracy in next week.  6 month follow up with me.  Let me know if there are questions.    Preventive Care 37 Years and Older, Male Preventive care refers to lifestyle choices and visits with your health care provider that can promote health and wellness. This includes:  A yearly physical exam. This is also called an annual well check.  Regular dental and eye exams.  Immunizations.  Screening for certain conditions.  Healthy lifestyle choices, such as diet and exercise. What can I expect for my preventive care visit? Physical exam Your health care provider will check:  Height and weight. These may be used to calculate body mass index (BMI), which is a measurement that tells if you are at a healthy weight.  Heart rate and blood pressure.  Your skin for abnormal spots. Counseling Your health care provider may ask you questions about:  Alcohol, tobacco, and drug use.  Emotional well-being.  Home and relationship well-being.  Sexual activity.  Eating habits.  History of falls.  Memory and ability to understand (cognition).  Work and work Statistician. What immunizations do I need?  Influenza (flu) vaccine  This is recommended every year. Tetanus, diphtheria, and pertussis (Tdap) vaccine  You may need a Td booster every 10 years. Varicella (chickenpox) vaccine  You may need this vaccine if you have not already been vaccinated. Zoster (shingles) vaccine  You may need this after age 45. Pneumococcal conjugate (PCV13) vaccine  One dose is recommended after age 52. Pneumococcal polysaccharide (PPSV23) vaccine  One dose is recommended after age 56. Measles, mumps, and rubella (MMR) vaccine  You may need at least one dose of MMR if you were born in 1957 or later. You may also need a second dose. Meningococcal conjugate (MenACWY) vaccine  You may need this if you have certain  conditions. Hepatitis A vaccine  You may need this if you have certain conditions or if you travel or work in places where you may be exposed to hepatitis A. Hepatitis B vaccine  You may need this if you have certain conditions or if you travel or work in places where you may be exposed to hepatitis B. Haemophilus influenzae type b (Hib) vaccine  You may need this if you have certain conditions. You may receive vaccines as individual doses or as more than one vaccine together in one shot (combination vaccines). Talk with your health care provider about the risks and benefits of combination vaccines. What tests do I need? Blood tests  Lipid and cholesterol levels. These may be checked every 5 years, or more frequently depending on your overall health.  Hepatitis C test.  Hepatitis B test. Screening  Lung cancer screening. You may have this screening every year starting at age 7 if you have a 30-pack-year history of smoking and currently smoke or have quit within the past 15 years.  Colorectal cancer screening. All adults should have this screening starting at age 33 and continuing until age 12. Your health care provider may recommend screening at age 67 if you are at increased risk. You will have tests every 1-10 years, depending on your results and the type of screening test.  Prostate cancer screening. Recommendations will vary depending on your family history and other risks.  Diabetes screening. This is done by checking your blood sugar (glucose) after you have not eaten for a while (fasting). You may have this done every 1-3 years.  Abdominal aortic aneurysm (AAA) screening. You may need this if you are a current or former smoker.  Sexually transmitted disease (STD) testing. Follow these instructions at home: Eating and drinking  Eat a diet that includes fresh fruits and vegetables, whole grains, lean protein, and low-fat dairy products. Limit your intake of foods with high  amounts of sugar, saturated fats, and salt.  Take vitamin and mineral supplements as recommended by your health care provider.  Do not drink alcohol if your health care provider tells you not to drink.  If you drink alcohol: ? Limit how much you have to 0-2 drinks a day. ? Be aware of how much alcohol is in your drink. In the [place] one drink equals one 12 oz bottle of beer (355 mL), one 5 oz glass of wine (148 mL), or one 1 oz glass of hard liquor (44 mL). Lifestyle  Take daily care of your teeth and gums.  Stay active. Exercise for at least 30 minutes on 5 or more days each week.  Do not use any products that contain nicotine or tobacco, such as cigarettes, e-cigarettes, and chewing tobacco. If you need help quitting, ask your health care provider.  If you are sexually active, practice safe sex. Use a condom or other form of protection to prevent STIs (sexually transmitted infections).  Talk with your health care provider about taking a low-dose aspirin or statin. What's next?  Visit your health care provider once a year for a well check visit.  Ask your health care provider how often you should have your eyes and teeth checked.  Stay up to date on all vaccines. This information is not intended to replace advice given to you by your health care provider. Make sure you discuss any questions you have with your health care provider. Document Revised: 04/09/2018 Document Reviewed: 04/09/2018 Elsevier Patient Education  [employer].    If you have lab work done today you will be contacted with your lab results within the next 2 weeks.  If you have not heard from [place] then please contact us. The fastest way to get your results is to register for My Chart.   IF you received an x-ray today, you will receive an invoice from [facility]. Please contact [facility] at [phone] with questions or concerns regarding your invoice.   IF you received labwork today,  you will receive an invoice from [place]. Please contact [facility] at [phone] with questions or concerns regarding your invoice.   Our billing staff will not be able to assist you with questions regarding bills from these companies.  You will be contacted with the lab results as soon as they are available. The fastest way to get your results is to activate your My Chart account. Instructions are located on the last page of this paperwork. If you have not heard from [place] regarding the results in 2 weeks, please contact this office.

## 2019-08-23 NOTE — Progress Notes (Signed)
Subjective:  Patient ID: Darrell Wells, male    DOB: [birth year]  Age: 69 y.o. MRN: [id number]  CC:  Chief Complaint  Patient presents with  . Medicare Wellness    pt statesother than a small cold he feels fine. pt reports a cough and some nasel drainage. pt reports he has had both of the [employer] vaccines as of last month.    HPI Darrell Wells presents for   Annual wellness exam  Upper respiratory infection last week, now improved.  No fever.  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  Recently seen March 15, restarted lisinopril 5 mg daily. Taking '5mg'$  qd.  Home readings:125/77-83.  Elevated readings at dentist office. Rare vertigo - has used meclizine if needed in the past for a few days only.   BP Readings from Last 3 Encounters:  08/23/19 (!) 137/97  07/12/19 (!) 143/94  02/03/19 (!) 146/95   Lab Results  Component Value Date   CREATININE 1.29 (H) 07/12/2019   Care team: PCP:me Podiatry: [facility].   Fall Risk  08/23/2019 07/12/2019 02/03/2019 01/11/2019 11/17/2018  Falls in the past year? 0 0 0 0 0  Number falls in past yr: - - - 0 -  Injury with Fall? - - - 0 0  Risk for fall due to : - - - - -  Follow up Falls evaluation completed Falls evaluation completed Falls evaluation completed Falls evaluation completed -  few throw rugs at home, adequate lighting.  Stairs - has handrail.   Cancer screening: Colonoscopy - last year, no repeat planned.  Prostate: on finasteride for BPH. After R/B testing discussed - deferred to next year.  Lab Results  Component Value Date   PSA1 0.5 07/10/2018   PSA1 0.5 05/26/2017   PSA 0.43 05/09/2015   PSA 0.50 05/03/2014   PSA 0.48 12/29/2012   Immunization History  Administered Date(s) Administered  . Fluad Quad(high Dose 65+) 01/11/2019  . Influenza, High Dose Seasonal PF 07/10/2018  . Influenza,inj,Quad PF,6+ Mos 05/03/2014, 05/09/2015, 04/18/2016, 05/26/2017    . PFIZER SARS-COV-2 Vaccination 06/28/2019, 07/21/2019  . Pneumococcal Conjugate-13 05/23/2016  . Pneumococcal Polysaccharide-23 05/26/2017   Depression screen [facility] 2/9 08/23/2019 07/12/2019 02/03/2019 01/11/2019 11/17/2018  Decreased Interest 0 0 0 0 0  Down, Depressed, Hopeless 0 0 0 0 0  PHQ - 2 Score 0 0 0 0 0   Functional Status Survey: Is the patient deaf or have difficulty hearing?: No Does the patient have difficulty seeing, even when wearing glasses/contacts?: No Does the patient have difficulty concentrating, remembering, or making decisions?: No Does the patient have difficulty walking or climbing stairs?: No Does the patient have difficulty dressing or bathing?: No Does the patient have difficulty doing errands alone such as visiting a doctor's office or shopping?: No   6CIT Screen 08/23/2019 07/12/2019 08/18/2018 07/10/2018  What Year? 0 points 0 points 0 points 0 points  What month? 0 points 0 points 0 points 0 points  What time? 0 points 0 points 0 points 0 points  Count back from 20 0 points 0 points 0 points 0 points  Months in reverse 0 points 0 points 0 points 0 points  Repeat phrase 0 points 0 points 0 points 0 points  Total Score 0 0 0 0    Hearing Screening   '125Hz'$  '250Hz'$  '500Hz'$  '1000Hz'$  '2000Hz'$  '3000Hz'$  '4000Hz'$  '6000Hz'$  '8000Hz'$   Right ear:           Left  ear:             Visual Acuity Screening   Right eye Left eye Both eyes  Without correction: 20/15-1 20/15 20/15  With correction:     no recent visit.   Dental: has appt planned locally.. Prior at [facility]. [place].   Exercise: 5-6 days per week, 66mn to 1.5hr.     [facility]  2      Advanced directives:  Has living will and POA - will bring in to scan.     History Patient Active Problem List   Diagnosis Date Noted  . Essential hypertension, benign 05/03/2014  . BPH (benign prostatic hyperplasia) 05/03/2014  . Erectile dysfunction 05/03/2014   Past  Medical History:  Diagnosis Date  . Enlarged prostate   . Hypertension   . Vertigo    Past Surgical History:  Procedure Laterality Date  . 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 REPAIR KNEE LIGAMENT Left    No Known Allergies Prior to Admission medications   Medication Sig Start Date End Date Taking? Authorizing Provider  diclofenac (VOLTAREN) 75 MG EC tablet TAKE 1 TABLET(75 MG) BY MOUTH TWICE DAILY 07/14/19  Yes RWallene Huh DPM  finasteride (PROSCAR) 5 MG tablet TAKE 1 TABLET(5 MG) BY MOUTH DAILY 07/12/19  Yes GWendie Agreste MD  lisinopril (ZESTRIL) 10 MG tablet Take 0.5 tablets (5 mg total) by mouth daily. 07/12/19  Yes GWendie Agreste MD  meclizine (ANTIVERT) 25 MG tablet Take 1 tablet (25 mg total) by mouth 3 (three) times daily as needed for dizziness. 11/17/18  Yes GWendie Agreste MD  sildenafil (VIAGRA) 100 MG tablet Take 0.5-1 tablets (50-100 mg total) by mouth as needed for erectile dysfunction. 07/12/19  Yes GWendie Agreste MD   Social History   Socioeconomic History  . Marital status: Single    Spouse name: Not on file  . Number of children: Not on file  . Years of education: Not on file  . Highest education level: Not on file  Occupational History  . Not on file  Tobacco Use  . Smoking status: Never Smoker  . Smokeless tobacco: Never Used  Substance and Sexual Activity  . Alcohol use: Yes    Comment: rare  . Drug use: No  . Sexual activity: Yes  Other Topics Concern  . Not on file  Social History Narrative  . Not on file   Social Determinants of Health   Financial Resource Strain:   . Difficulty of Paying Living Expenses:   Food Insecurity:   . Worried About RCharity fundraiserin the Last Year:   . RArboriculturistin the Last Year:   Transportation Needs:   . LFilm/video editor(Medical):   .[name] KitchenLack of Transportation (Non-Medical):   Physical Activity:   . Days of Exercise per Week:   . Minutes of Exercise per  Session:   Stress:   . Feeling of Stress :   Social Connections:   . Frequency of Communication with Friends and Family:   . Frequency of Social Gatherings with Friends and Family:   . Attends Religious Services:   . Active Member of Clubs or Organizations:   . Attends CArchivistMeetings:   .[name] KitchenMarital Status:   Intimate Partner Violence:   . Fear of Current or Ex-Partner:   . Emotionally Abused:   .[name] KitchenPhysically  Abused:   . Sexually Abused:     Review of Systems Per HPI.   Objective:   Vitals:   08/23/19 1116  BP: (!) 137/97  Pulse: 73  Temp: 98.7 F (37.1 C)  TempSrc: Temporal  SpO2: 100%  Weight: 165 lb (74.8 kg)  Height: '5\' 9"'$  (1.753 m)     Physical Exam Vitals reviewed.  Constitutional:      Appearance: He is well-developed.  HENT:     Head: Normocephalic and atraumatic.     Right Ear: External ear normal.     Left Ear: External ear normal.  Eyes:     Conjunctiva/sclera: Conjunctivae normal.     Pupils: Pupils are equal, round, and reactive to light.  Neck:     Thyroid: No thyromegaly.  Cardiovascular:     Rate and Rhythm: Normal rate and regular rhythm.     Heart sounds: Normal heart sounds.  Pulmonary:     Effort: Pulmonary effort is normal. No respiratory distress.     Breath sounds: Normal breath sounds. No wheezing.  Abdominal:     General: There is no distension.     Palpations: Abdomen is soft.     Tenderness: There is no abdominal tenderness.  Musculoskeletal:        General: No tenderness. Normal range of motion.     Cervical back: Normal range of motion and neck supple.  Lymphadenopathy:     Cervical: No cervical adenopathy.  Skin:    General: Skin is warm and dry.  Neurological:     Mental Status: He is alert and oriented to person, place, and time.     Deep Tendon Reflexes: Reflexes are normal and symmetric.  Psychiatric:        Behavior: Behavior normal.      Assessment & Plan:  Darrell Wells is a 69 y.o. male  . Encounter for Medicare annual wellness exam  - - anticipatory guidance as below in AVS, screening labs if needed. Health maintenance items as above in HPI discussed/recommended as applicable.  - no concerning responses on depression, fall, or functional status screening. Any positive responses noted as above. Advanced directives discussed as in CHL.   Essential hypertension  - suspected white coat component. Nurse visit to check BP on his meter to verify. No med changes for now.   Vertigo - Plan: meclizine (ANTIVERT) 25 MG tablet  -As needed meclizine, RTC precautions if more persistent.  Meds ordered this encounter  Medications  . meclizine (ANTIVERT) 25 MG tablet    Sig: Take 1 tablet (25 mg total) by mouth 3 (three) times daily as needed for dizziness.    Dispense:  30 tablet    Refill:  2   Patient Instructions    No med changes today.  Nurse visit with home monitor to check accuracy in next week.  6 month follow up with me.  Let me know if there are questions.    Preventive Care 61 Years and Older, Male Preventive care refers to lifestyle choices and visits with your health care provider that can promote health and wellness. This includes:  A yearly physical exam. This is also called an annual well check.  Regular dental and eye exams.  Immunizations.  Screening for certain conditions.  Healthy lifestyle choices, such as diet and exercise. What can I expect for my preventive care visit? Physical exam Your health care provider will check:  Height and weight. These may be used to calculate body mass index (BMI),  which is a measurement that tells if you are at a healthy weight.  Heart rate and blood pressure.  Your skin for abnormal spots. Counseling Your health care provider may ask you questions about:  Alcohol, tobacco, and drug use.  Emotional well-being.  Home and relationship well-being.  Sexual activity.  Eating habits.  History of  falls.  Memory and ability to understand (cognition).  Work and work Statistician. What immunizations do I need?  Influenza (flu) vaccine  This is recommended every year. Tetanus, diphtheria, and pertussis (Tdap) vaccine  You may need a Td booster every 10 years. Varicella (chickenpox) vaccine  You may need this vaccine if you have not already been vaccinated. Zoster (shingles) vaccine  You may need this after age 42. Pneumococcal conjugate (PCV13) vaccine  One dose is recommended after age 7. Pneumococcal polysaccharide (PPSV23) vaccine  One dose is recommended after age 43. Measles, mumps, and rubella (MMR) vaccine  You may need at least one dose of MMR if you were born in 1957 or later. You may also need a second dose. Meningococcal conjugate (MenACWY) vaccine  You may need this if you have certain conditions. Hepatitis A vaccine  You may need this if you have certain conditions or if you travel or work in places where you may be exposed to hepatitis A. Hepatitis B vaccine  You may need this if you have certain conditions or if you travel or work in places where you may be exposed to hepatitis B. Haemophilus influenzae type b (Hib) vaccine  You may need this if you have certain conditions. You may receive vaccines as individual doses or as more than one vaccine together in one shot (combination vaccines). Talk with your health care provider about the risks and benefits of combination vaccines. What tests do I need? Blood tests  Lipid and cholesterol levels. These may be checked every 5 years, or more frequently depending on your overall health.  Hepatitis C test.  Hepatitis B test. Screening  Lung cancer screening. You may have this screening every year starting at age 67 if you have a 30-pack-year history of smoking and currently smoke or have quit within the past 15 years.  Colorectal cancer screening. All adults should have this screening starting at age 78  and continuing until age 93. Your health care provider may recommend screening at age 49 if you are at increased risk. You will have tests every 1-10 years, depending on your results and the type of screening test.  Prostate cancer screening. Recommendations will vary depending on your family history and other risks.  Diabetes screening. This is done by checking your blood sugar (glucose) after you have not eaten for a while (fasting). You may have this done every 1-3 years.  Abdominal aortic aneurysm (AAA) screening. You may need this if you are a current or former smoker.  Sexually transmitted disease (STD) testing. Follow these instructions at home: Eating and drinking  Eat a diet that includes fresh fruits and vegetables, whole grains, lean protein, and low-fat dairy products. Limit your intake of foods with high amounts of sugar, saturated fats, and salt.  Take vitamin and mineral supplements as recommended by your health care provider.  Do not drink alcohol if your health care provider tells you not to drink.  If you drink alcohol: ? Limit how much you have to 0-2 drinks a day. ? Be aware of how much alcohol is in your drink. In the [place] one drink equals one 12  oz bottle of beer (355 mL), one 5 oz glass of wine (148 mL), or one 1 oz glass of hard liquor (44 mL). Lifestyle  Take daily care of your teeth and gums.  Stay active. Exercise for at least 30 minutes on 5 or more days each week.  Do not use any products that contain nicotine or tobacco, such as cigarettes, e-cigarettes, and chewing tobacco. If you need help quitting, ask your health care provider.  If you are sexually active, practice safe sex. Use a condom or other form of protection to prevent STIs (sexually transmitted infections).  Talk with your health care provider about taking a low-dose aspirin or statin. What's next?  Visit your health care provider once a year for a well check visit.  Ask your health care  provider how often you should have your eyes and teeth checked.  Stay up to date on all vaccines. This information is not intended to replace advice given to you by your health care provider. Make sure you discuss any questions you have with your health care provider. Document Revised: 04/09/2018 Document Reviewed: 04/09/2018 Elsevier Patient Education  [employer].    If you have lab work done today you will be contacted with your lab results within the next 2 weeks.  If you have not heard from [place] then please contact us. The fastest way to get your results is to register for My Chart.   IF you received an x-ray today, you will receive an invoice from [facility]. Please contact [facility] at [phone] with questions or concerns regarding your invoice.   IF you received labwork today, you will receive an invoice from [facility]. Please contact [facility] at [phone] with questions or concerns regarding your invoice.   Our billing staff will not be able to assist you with questions regarding bills from these companies.  You will be contacted with the lab results as soon as they are available. The fastest way to get your results is to activate your My Chart account. Instructions are located on the last page of this paperwork. If you have not heard from [place] regarding the results in 2 weeks, please contact this office.         Signed, Merri Ray, MD [facility]

## 2019-08-25 ENCOUNTER — Ambulatory Visit: Payer: Medicare Other

## 2019-11-10 ENCOUNTER — Other Ambulatory Visit: Payer: Self-pay | Admitting: Family Medicine

## 2019-11-10 DIAGNOSIS — N529 Male erectile dysfunction, unspecified: Secondary | ICD-10-CM

## 2019-11-10 NOTE — Telephone Encounter (Signed)
Requested medications are due for refill today?  Yes  Requested medications are on active medication list?  Yes  Last Refill:   07/12/2019  # 25 with 3 refills  Future visit scheduled?  Yes   Notes to Clinic:   Noted BP elevated last two visits.  On 08/23/2019  BP 137/97.  On 07/12/2019 BP 143/94.  Hesitant to refill, as I did not know if patient is checking his BP at home vs white coat syndrome vs does he need antihypertensive medication adjustment?  Please review.

## 2019-11-10 NOTE — Telephone Encounter (Signed)
Patient is requesting a refill of the following medications: Requested Prescriptions   Pending Prescriptions Disp Refills   sildenafil (VIAGRA) 100 MG tablet [Pharmacy Med Name: SILDENAFIL TABS 100MG ] 18 tablet 3    Sig: TAKE ONE-HALF (1/2) TO ONE TABLET DAILY AS NEEDED FOR ERECTILE DYSFUNCTION    Date of patient request:11/10/19 Last office visit: 08/23/19 Date of last refill: 07/12/19 Last refill amount: 25 -3rf Follow up time period per chart: 02/23/20

## 2020-01-01 ENCOUNTER — Other Ambulatory Visit: Payer: Self-pay | Admitting: Family Medicine

## 2020-01-01 DIAGNOSIS — R351 Nocturia: Secondary | ICD-10-CM

## 2020-01-01 NOTE — Telephone Encounter (Signed)
Requested Prescriptions  Pending Prescriptions Disp Refills   finasteride (PROSCAR) 5 MG tablet [Pharmacy Med Name: FINASTERIDE 5MG  TABLETS] 90 tablet 1    Sig: TAKE 1 TABLET(5 MG) BY MOUTH DAILY     Urology: 5-alpha Reductase Inhibitors Passed - 01/01/2020  8:51 AM      Passed - Valid encounter within last 12 months    Recent Outpatient Visits          4 months ago Encounter for Medicare annual wellness exam   [facility], Ranell Patrick, MD   5 months ago Essential hypertension   [facility], Ranell Patrick, MD   11 months ago Right foot pain   [facility] at [facility], MD   11 months ago Essential hypertension, benign   [facility], Ranell Patrick, MD   1 year ago Vertigo   [facility], Ranell Patrick, MD      Future Appointments            In 1 month [name] Ranell Patrick, MD Primary [facility] at [place], [facility]

## 2020-01-22 ENCOUNTER — Other Ambulatory Visit: Payer: Self-pay | Admitting: Family Medicine

## 2020-01-22 DIAGNOSIS — N529 Male erectile dysfunction, unspecified: Secondary | ICD-10-CM

## 2020-02-23 ENCOUNTER — Other Ambulatory Visit: Payer: Self-pay

## 2020-02-23 ENCOUNTER — Ambulatory Visit (INDEPENDENT_AMBULATORY_CARE_PROVIDER_SITE_OTHER): Payer: Medicare Other | Admitting: Family Medicine

## 2020-02-23 ENCOUNTER — Encounter: Payer: Self-pay | Admitting: Family Medicine

## 2020-02-23 VITALS — BP 136/88 | HR 78 | Temp 97.7°F | Ht 69.0 in | Wt 165.0 lb

## 2020-02-23 DIAGNOSIS — N529 Male erectile dysfunction, unspecified: Secondary | ICD-10-CM | POA: Diagnosis not present

## 2020-02-23 DIAGNOSIS — R42 Dizziness and giddiness: Secondary | ICD-10-CM

## 2020-02-23 DIAGNOSIS — I10 Essential (primary) hypertension: Secondary | ICD-10-CM

## 2020-02-23 DIAGNOSIS — R351 Nocturia: Secondary | ICD-10-CM | POA: Diagnosis not present

## 2020-02-23 DIAGNOSIS — Z23 Encounter for immunization: Secondary | ICD-10-CM

## 2020-02-23 DIAGNOSIS — N401 Enlarged prostate with lower urinary tract symptoms: Secondary | ICD-10-CM

## 2020-02-23 DIAGNOSIS — N182 Chronic kidney disease, stage 2 (mild): Secondary | ICD-10-CM | POA: Diagnosis not present

## 2020-02-23 MED ORDER — LISINOPRIL 10 MG PO TABS: 5.0000 mg | ORAL_TABLET | Freq: Every day | ORAL | 3 refills | Status: DC

## 2020-02-23 MED ORDER — MECLIZINE HCL 25 MG PO TABS: 25.0000 mg | ORAL_TABLET | Freq: Three times a day (TID) | ORAL | 2 refills | Status: DC | PRN

## 2020-02-23 MED ORDER — FINASTERIDE 5 MG PO TABS: 5.0000 mg | ORAL_TABLET | Freq: Every day | ORAL | 1 refills | Status: DC

## 2020-02-23 MED ORDER — SILDENAFIL CITRATE 100 MG PO TABS: ORAL_TABLET | ORAL | Status: DC

## 2020-02-23 NOTE — Progress Notes (Signed)
Subjective:  Patient ID: Darrell Wells, male    DOB: 1950-09-29  Age: 69 y.o. MRN: [id number]  CC:  Chief Complaint  Patient presents with  . Follow-up    on hypertension and vertigo. PT reports no issues with BP since lat OV. pt reports his home readings arte around 123/88 most of the time. Pt reports he has been having issues with his vertigo since last OV and has been checking his BP to make sure it isn't the hypertension. pt reports his BP is normal during vertigo episodes.    HPI Darrell Wells presents for   Hypertension: With history of chronic kidney disease, prior nephrectomy donated kidney. Lisinopril 5 mg daily.  Suspect whitecoat hypertension component previously. Home readings:118-125/80's.  No new cough/side effects.   BP Readings from Last 3 Encounters:  02/23/20 136/88  08/23/19 (!) 137/97  07/12/19 (!) 143/94   Lab Results  Component Value Date   CREATININE 1.29 (H) 07/12/2019  Creatinine improved from previous reading of 1.41 in September 2020  History of benign positional vertigo Treatment symptoms previously, treated with meclizine. Has had some recurrence lately- past few weeks.   No new neuro sx's - no weakness, headache.  Notes with bending forward, or early in am, or with turning head too fast.  Taking meclizine - every other day to once per day - helps sx's.  No CP, palpitations.  No syncope/near syncope.  No recent ENT eval. Drinking fluids.   Erectile dysfunction: Has used Viagra 100 mg 1/2-1 as needed.  Also has taken finasteride 5 mg daily for BPH with nocturia.  Stable symptoms - no nocturia at times, usually 1-2 times per night. No no HA, flushing, vision/hearing change, no CP with exertion.  Viagra working well.   Health maintenance: Flu vaccine given today. covid booster 2 weeks ago.   History Patient Active Problem List   Diagnosis Date Noted  . Essential hypertension, benign 05/03/2014  . BPH (benign prostatic hyperplasia)  05/03/2014  . Erectile dysfunction 05/03/2014   Past Medical History:  Diagnosis Date  . Enlarged prostate   . Hypertension   . Vertigo    Past Surgical History:  Procedure Laterality Date  . 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 REPAIR KNEE LIGAMENT Left    No Known Allergies Prior to Admission medications   Medication Sig Start Date End Date Taking? Authorizing Provider  diclofenac (VOLTAREN) 75 MG EC tablet TAKE 1 TABLET(75 MG) BY MOUTH TWICE DAILY 07/14/19  Yes Wallene Huh, DPM  finasteride (PROSCAR) 5 MG tablet TAKE 1 TABLET(5 MG) BY MOUTH DAILY 01/01/20  Yes [name], MD  lisinopril (ZESTRIL) 10 MG tablet Take 0.5 tablets (5 mg total) by mouth daily. 07/12/19  Yes [name], MD  meclizine (ANTIVERT) 25 MG tablet Take 1 tablet (25 mg total) by mouth 3 (three) times daily as needed for dizziness. 08/23/19  Yes [name], MD  sildenafil (VIAGRA) 100 MG tablet TAKE 1/2 TO 1 TABLET BY MOUTH AS NEEDED FOR ERECTILE DYSFUNCTION 01/22/20  Yes [name], MD   Social History   Socioeconomic History  . Marital status: Single    Spouse name: Not on file  . Number of children: Not on file  . Years of education: Not on file  . Highest education level: Not on file  Occupational History  . Not on file  Tobacco Use  . Smoking status: Never Smoker  . Smokeless tobacco: Never Used  Vaping Use  . Vaping Use: Never used  Substance and Sexual Activity  . Alcohol use: Yes    Comment: rare  . Drug use: No  . Sexual activity: Yes  Other Topics Concern  . Not on file  Social History Narrative  . Not on file   Social Determinants of Health   Financial Resource Strain:   . Difficulty of Paying Living Expenses: Not on file  Food Insecurity:   . Worried About Charity fundraiser in the Last Year: Not on file  . Ran Out of Food in the Last Year: Not on file  Transportation Needs:   . Lack of Transportation (Medical): Not on file  . Lack  of Transportation (Non-Medical): Not on file  Physical Activity:   . Days of Exercise per Week: Not on file  . Minutes of Exercise per Session: Not on file  Stress:   . Feeling of Stress : Not on file  Social Connections:   . Frequency of Communication with Friends and Family: Not on file  . Frequency of Social Gatherings with Friends and Family: Not on file  . Attends Religious Services: Not on file  . Active Member of Clubs or Organizations: Not on file  . Attends Archivist Meetings: Not on file  . Marital Status: Not on file  Intimate Partner Violence:   . Fear of Current or Ex-Partner: Not on file  . Emotionally Abused: Not on file  . Physically Abused: Not on file  . Sexually Abused: Not on file    Review of Systems  Constitutional: Negative for fatigue and unexpected weight change.  Eyes: Negative for visual disturbance.  Respiratory: Negative for cough, chest tightness and shortness of breath.   Cardiovascular: Negative for chest pain, palpitations and leg swelling.  Gastrointestinal: Negative for abdominal pain and blood in stool.  Neurological: Positive for dizziness (vertigo). Negative for light-headedness and headaches.     Objective:   Vitals:   02/23/20 0802 02/23/20 0804  BP: (!) 153/100 136/88  Pulse: 78   Temp: 97.7 F (36.5 C)   TempSrc: Temporal   SpO2: 100%   Weight: 165 lb (74.8 kg)   Height: 5\' 9"  (1.753 m)      Physical Exam Vitals reviewed.  Constitutional:      Appearance: He is well-developed.  HENT:     Head: Normocephalic and atraumatic.  Eyes:     Extraocular Movements:     Right eye: Nystagmus (horizontal fast right with supine and then to sitting postition. reproduces symptoms. ) present.     Left eye: Nystagmus present.     Pupils: Pupils are equal, round, and reactive to light.  Neck:     Vascular: No carotid bruit or JVD.  Cardiovascular:     Rate and Rhythm: Normal rate and regular rhythm.     Heart sounds:  Normal heart sounds. No murmur heard.   Pulmonary:     Effort: Pulmonary effort is normal.     Breath sounds: Normal breath sounds. No rales.  Skin:    General: Skin is warm and dry.  Neurological:     General: No focal deficit present.     Mental Status: He is alert and oriented to person, place, and time. Mental status is at baseline.     Gait: Gait normal.      30 minutes spent during visit, greater than 50% counseling and assimilation of information, chart review, and discussion of plan.    Assessment &  Plan:  Darrell Wells is a 69 y.o. male . Essential hypertension, benign - Plan: lisinopril (ZESTRIL) 10 MG tablet, Basic metabolic panel  -  Stable, tolerating current regimen. Medications refilled. Labs pending as above.   Need for vaccination - Plan: Flu Vaccine QUAD High Dose(Fluad)  Erectile dysfunction, unspecified erectile dysfunction type - Plan: sildenafil (VIAGRA) 100 MG tablet  - viagra Rx given - use lowest effective dose. Side effects previously discussed (including but not limited to headache/flushing, blue discoloration of vision, possible vascular steal and risk of cardiac effects if underlying unknown coronary artery disease, and permanent sensorineural hearing loss). Understanding expressed.  Benign prostatic hyperplasia with nocturia - Plan: finasteride (PROSCAR) 5 MG tablet  - stable on finasteride.   Vertigo - Plan: Ambulatory referral to ENT, meclizine (ANTIVERT) 25 MG tablet  -Increased frequency without any acute findings on neurologic exam or new neurologic symptoms.  Continue meclizine, but referred to ENT to evaluate further, consider vestibular rehab.  CKD (chronic kidney disease) stage 2, GFR 60-89 ml/min - Plan: Basic metabolic panel  -Improved renal function testing.  Updated testing ordered.  Continue hydration.  Meds ordered this encounter  Medications  . sildenafil (VIAGRA) 100 MG tablet    Sig: TAKE 1/2 TO 1 TABLET BY MOUTH AS NEEDED FOR  ERECTILE DYSFUNCTION    Dispense:  25 tablet    Refill:  05  . finasteride (PROSCAR) 5 MG tablet    Sig: Take 1 tablet (5 mg total) by mouth daily.    Dispense:  90 tablet    Refill:  1  . meclizine (ANTIVERT) 25 MG tablet    Sig: Take 1 tablet (25 mg total) by mouth 3 (three) times daily as needed for dizziness.    Dispense:  30 tablet    Refill:  2  . lisinopril (ZESTRIL) 10 MG tablet    Sig: Take 0.5 tablets (5 mg total) by mouth daily.    Dispense:  45 tablet    Refill:  3   Patient Instructions       If you have lab work done today you will be contacted with your lab results within the next 2 weeks.  If you have not heard from [place] then please contact us. The fastest way to get your results is to register for My Chart.   IF you received an x-ray today, you will receive an invoice from [facility]. Please contact [facility] at [phone] with questions or concerns regarding your invoice.   IF you received labwork today, you will receive an invoice from [facility]. Please contact [facility] at [phone] with questions or concerns regarding your invoice.   Our billing staff will not be able to assist you with questions regarding bills from these companies.  You will be contacted with the lab results as soon as they are available. The fastest way to get your results is to activate your My Chart account. Instructions are located on the last page of this paperwork. If you have not heard from [place] regarding the results in 2 weeks, please contact this office.    No med changes at this time. I will check some labs, and will refer you to ear nose and throat specialist to discuss the vertigo. Return to the clinic or go to the nearest emergency room if any of your symptoms worsen or new symptoms occur.      Signed, Merri Ray, MD [facility]

## 2020-02-23 NOTE — Patient Instructions (Addendum)
     If you have lab work done today you will be contacted with your lab results within the next 2 weeks.  If you have not heard from [place] then please contact us. The fastest way to get your results is to register for My Chart.   IF you received an x-ray today, you will receive an invoice from [facility]. Please contact [facility] at [phone] with questions or concerns regarding your invoice.   IF you received labwork today, you will receive an invoice from [facility]. Please contact [facility] at [phone] with questions or concerns regarding your invoice.   Our billing staff will not be able to assist you with questions regarding bills from these companies.  You will be contacted with the lab results as soon as they are available. The fastest way to get your results is to activate your My Chart account. Instructions are located on the last page of this paperwork. If you have not heard from [place] regarding the results in 2 weeks, please contact this office.    No med changes at this time. I will check some labs, and will refer you to ear nose and throat specialist to discuss the vertigo. Return to the clinic or go to the nearest emergency room if any of your symptoms worsen or new symptoms occur.

## 2020-02-24 LAB — BASIC METABOLIC PANEL
BUN/Creatinine Ratio: 14 (ref 10–24)
BUN: 17 mg/dL (ref 8–27)
CO2: 22 mmol/L (ref 20–29)
Calcium: 9.4 mg/dL (ref 8.6–10.2)
Chloride: 104 mmol/L (ref 96–106)
Creatinine, Ser: 1.21 mg/dL (ref 0.76–1.27)
GFR calc Af Amer: 70 mL/min/{1.73_m2} (ref >59)
GFR calc non Af Amer: 61 mL/min/{1.73_m2} (ref >59)
Glucose: 80 mg/dL (ref 65–99)
Potassium: 4.4 mmol/L (ref 3.5–5.2)
Sodium: 136 mmol/L (ref 134–144)

## 2020-03-27 ENCOUNTER — Other Ambulatory Visit: Payer: Self-pay

## 2020-03-27 ENCOUNTER — Encounter (INDEPENDENT_AMBULATORY_CARE_PROVIDER_SITE_OTHER): Payer: Self-pay | Admitting: Otolaryngology

## 2020-03-27 ENCOUNTER — Ambulatory Visit (INDEPENDENT_AMBULATORY_CARE_PROVIDER_SITE_OTHER): Payer: Medicare Other | Admitting: Otolaryngology

## 2020-03-27 VITALS — Temp 94.6°F

## 2020-03-27 DIAGNOSIS — R42 Dizziness and giddiness: Secondary | ICD-10-CM | POA: Diagnosis not present

## 2020-03-27 NOTE — Progress Notes (Signed)
HPI: Darrell Wells is a 69 y.o. male who presents is referred by Dr. [name] for evaluation of vertigo.  Patient states that he initially experienced vertigo in 2010.  He has not noted any hearing problems.  Sometimes the vertigo may only last for several hours and sometimes it may last for several days.  He has always taken meclizine when he has the vertigo or feels like the vertigo may start and this seems to help.  He has not noted any hearing problems associated with this.  He does have a history of hypertension and takes medication for hypertension. His last episode of vertigo was about a week and a half ago.  He is having no vertigo or balance problems today.  Past Medical History:  Diagnosis Date  . Enlarged prostate   . Hypertension   . Vertigo    Past Surgical History:  Procedure Laterality Date  . 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 REPAIR KNEE LIGAMENT Left    Social History   Socioeconomic History  . Marital status: Single    Spouse name: Not on file  . Number of children: Not on file  . Years of education: Not on file  . Highest education level: Not on file  Occupational History  . Not on file  Tobacco Use  . Smoking status: Never Smoker  . Smokeless tobacco: Never Used  Vaping Use  . Vaping Use: Never used  Substance and Sexual Activity  . Alcohol use: Yes    Comment: rare  . Drug use: No  . Sexual activity: Yes  Other Topics Concern  . Not on file  Social History Narrative  . Not on file   Social Determinants of Health   Financial Resource Strain:   . Difficulty of Paying Living Expenses: Not on file  Food Insecurity:   . Worried About Charity fundraiser in the Last Year: Not on file  . Ran Out of Food in the Last Year: Not on file  Transportation Needs:   . Lack of Transportation (Medical): Not on file  . Lack of Transportation (Non-Medical): Not on file  Physical Activity:   . Days of Exercise per Week: Not on file  . Minutes of  Exercise per Session: Not on file  Stress:   . Feeling of Stress : Not on file  Social Connections:   . Frequency of Communication with Friends and Family: Not on file  . Frequency of Social Gatherings with Friends and Family: Not on file  . Attends Religious Services: Not on file  . Active Member of Clubs or Organizations: Not on file  . Attends Archivist Meetings: Not on file  . Marital Status: Not on file   Family History  Problem Relation Age of Onset  . Stroke Mother   . Heart disease Brother   . Colon cancer Neg Hx   . Esophageal cancer Neg Hx   . Rectal cancer Neg Hx   . Stomach cancer Neg Hx    No Known Allergies Prior to Admission medications   Medication Sig Start Date End Date Taking? Authorizing Provider  diclofenac (VOLTAREN) 75 MG EC tablet TAKE 1 TABLET(75 MG) BY MOUTH TWICE DAILY 07/14/19  Yes Regal, Tamala Fothergill, DPM  finasteride (PROSCAR) 5 MG tablet Take 1 tablet (5 mg total) by mouth daily. 02/23/20  Yes [name], MD  lisinopril (ZESTRIL) 10 MG tablet Take 0.5 tablets (5 mg total) by mouth daily. 02/23/20  Yes [name], MD  meclizine (ANTIVERT) 25 MG tablet Take 1 tablet (25 mg total) by mouth 3 (three) times daily as needed for dizziness. 02/23/20  Yes [name], MD  sildenafil (VIAGRA) 100 MG tablet TAKE 1/2 TO 1 TABLET BY MOUTH AS NEEDED FOR ERECTILE DYSFUNCTION 02/23/20  Yes [name], MD     Positive ROS: Otherwise negative  All other systems have been reviewed and were otherwise negative with the exception of those mentioned in the HPI and as above.  Physical Exam: Constitutional: Alert, well-appearing, no acute distress Ears: External ears without lesions or tenderness. Ear canals are clear bilaterally.  Both TMs are clear.  On Dix-Hallpike testing he had no clinical evidence of BPPV in the office today.  On hearing screening with the 512 1024 tuning fork he had minimal hearing loss which was symmetric but perhaps  heard a little bit better on the right side. Nasal: External nose without lesions. Septum with minimal deformity.. Clear nasal passages Oral: Lips and gums without lesions. Tongue and palate mucosa without lesions. Posterior oropharynx clear.  Patient is status post tonsillectomy. Neck: No palpable adenopathy or masses.  Auscultation of the neck reveals no carotid bruits. Respiratory: Breathing comfortably  Skin: No facial/neck lesions or rash noted.  Procedures  Assessment: History of vertigo  Plan: We will plan on scheduling him for VNG testing to evaluate vestibular function. He will call us following the VNG test   Radene Journey, MD   CC:

## 2020-04-21 ENCOUNTER — Other Ambulatory Visit: Payer: Self-pay | Admitting: Family Medicine

## 2020-04-21 DIAGNOSIS — N529 Male erectile dysfunction, unspecified: Secondary | ICD-10-CM

## 2020-04-23 NOTE — Telephone Encounter (Signed)
Requested Prescriptions  Pending Prescriptions Disp Refills  . sildenafil (VIAGRA) 100 MG tablet [Pharmacy Med Name: SILDENAFIL 100MG  TABLETS] 25 tablet 5    Sig: TAKE 1/2 TO 1 TABLET BY MOUTH AS NEEDED FOR ERECTILE DYSFUNCTION     Urology: Erectile Dysfunction Agents Passed - 04/21/2020  9:18 AM      Passed - Last BP in normal range    BP Readings from Last 1 Encounters:  02/23/20 136/88         Passed - Valid encounter within last 12 months    Recent Outpatient Visits          2 months ago Essential hypertension, benign   [facility], Ranell Patrick, MD   8 months ago Encounter for Medicare annual wellness exam   [facility], Ranell Patrick, MD   9 months ago Essential hypertension   [facility], Ranell Patrick, MD   1 year ago Right foot pain   [facility], Ines Bloomer, MD   1 year ago Essential hypertension, benign   [facility], Ranell Patrick, MD      Future Appointments            In 4 months [name] Ranell Patrick, MD [facility] [place], [facility]

## 2020-06-02 IMAGING — MR MR HEAD WO/W CM
10 of 13 series · 36 of 48 positions shown · IV contrast (multihance)
Comparison: None.

CLINICAL DATA: Acute non intractable headache

EXAM:
MRI HEAD WITHOUT AND WITH CONTRAST
TECHNIQUE: Multiplanar, multiecho pulse sequences of the brain and surrounding
structures were obtained without and with intravenous contrast.
CONTRAST:  15mL MULTIHANCE GADOBENATE DIMEGLUMINE 529 MG/ML IV SOLN

[Series 3: DWI · axial · 3.0mm · 1.09mm/px · z∈[-48,+86]mm · 8 of 92 slices shown (1 of 4)]
[im 1/92]
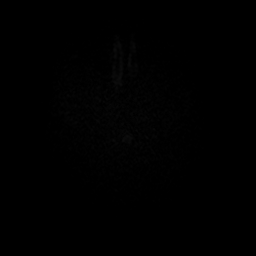
[im 14/92]
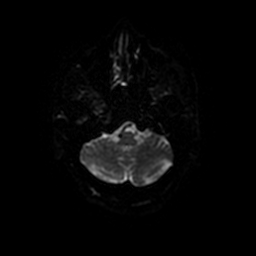
[im 27/92]
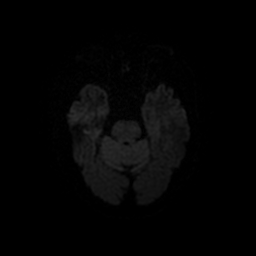
[im 40/92]
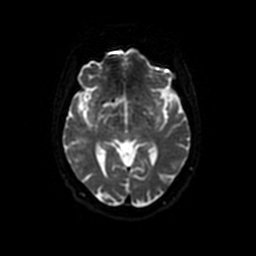
[im 53/92]
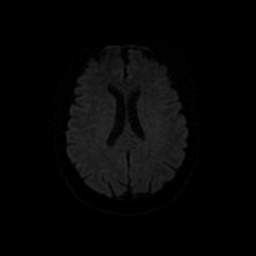
[im 66/92]
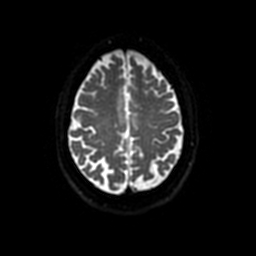
[im 79/92]
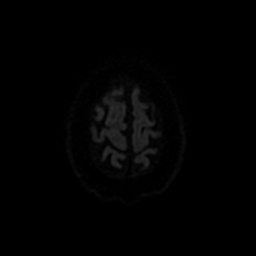
[im 92/92]
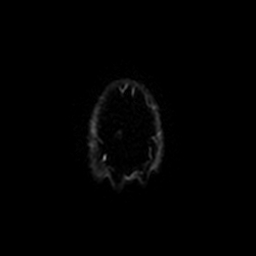

[Series 4: T1 · sagittal · 5.0mm · 0.47mm/px · 2 of 24 slices shown]
[im 1/24]
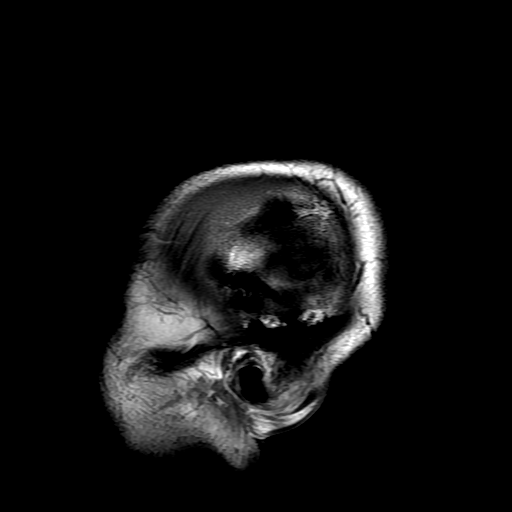
[im 24/24]
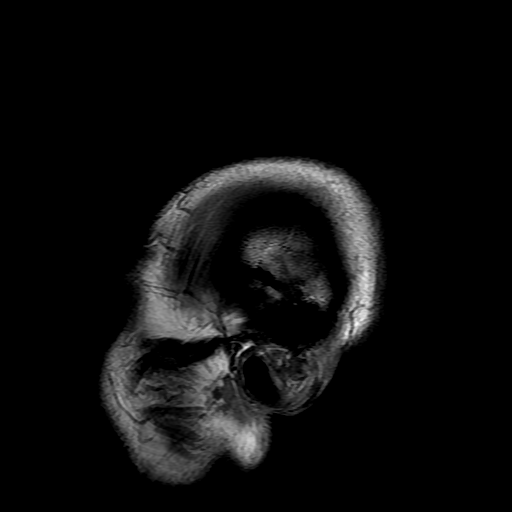

[Series 5: DWI · coronal · 3.0mm · 1.09mm/px · 8 of 100 slices shown (2 of 4)]
[im 1/100]
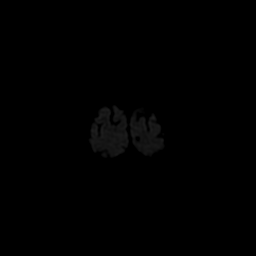
[im 15/100]
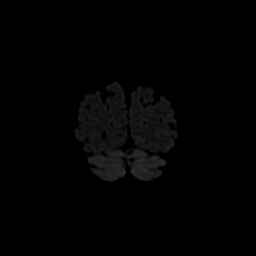
[im 29/100]
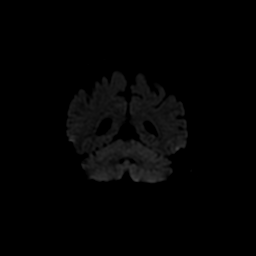
[im 43/100]
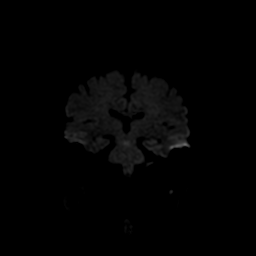
[im 57/100]
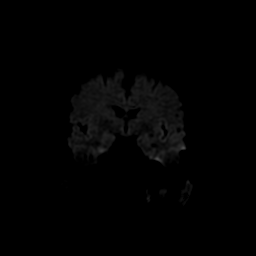
[im 71/100]
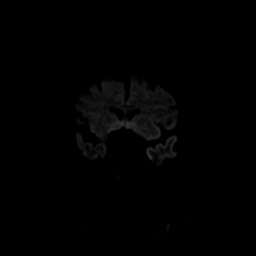
[im 85/100]
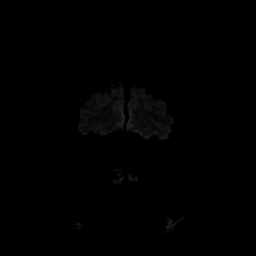
[im 100/100]
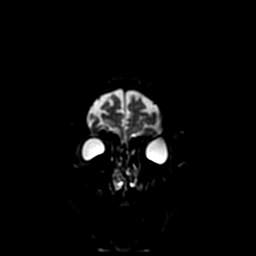

[Series 6: T2 · axial · 5.0mm · 0.47mm/px · z∈[-66,+93]mm · 2 of 24 slices shown]
[im 1/24]
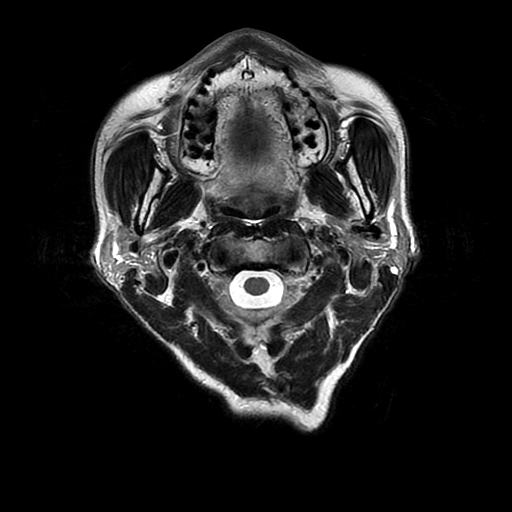
[im 24/24]
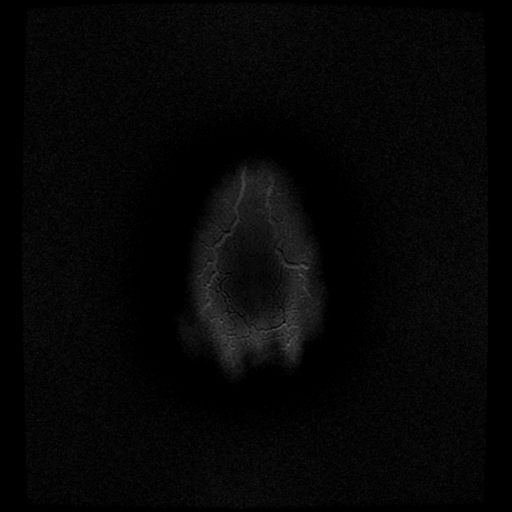

[Series 7: FLAIR · axial · 3.0mm · 0.43mm/px · z∈[-64,+84]mm · 2 of 26 slices shown]
[im 1/26]
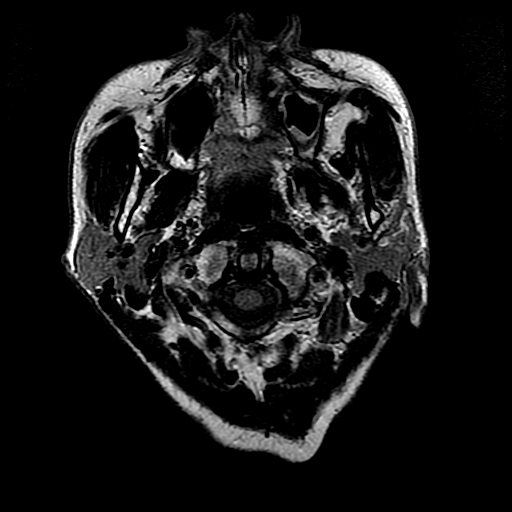
[im 26/26]
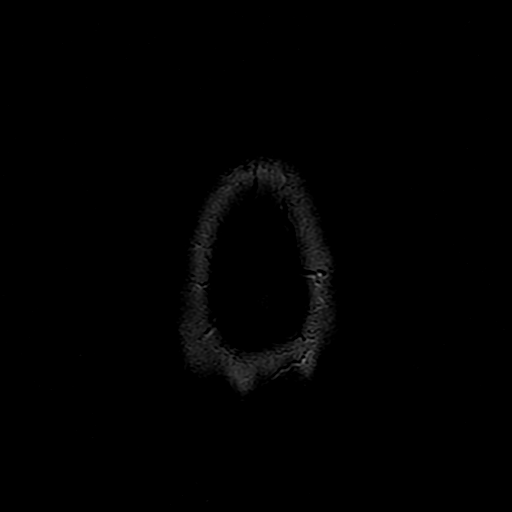

[Series 10: T2 post-contrast · coronal · 5.0mm · 0.47mm/px · 2 of 24 slices shown]
[im 1/24]
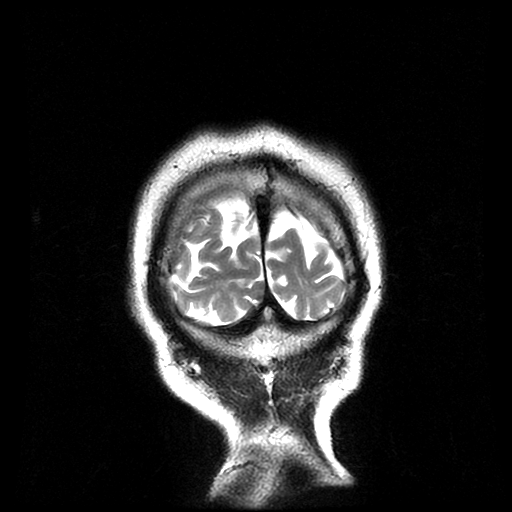
[im 24/24]
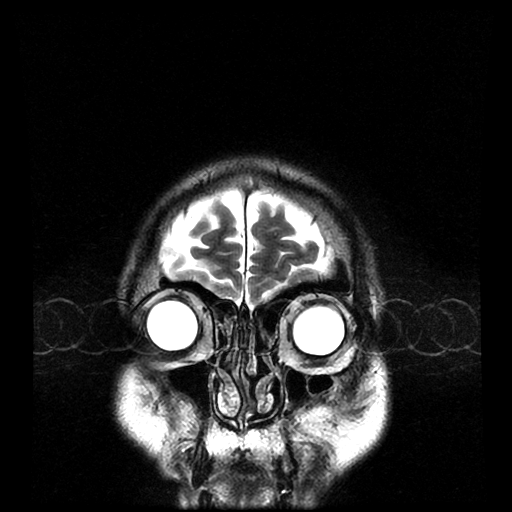

[Series 12: T1 post-contrast · coronal · 5.0mm · 0.47mm/px · 2 of 24 slices shown (1 of 2)]
[im 1/24]
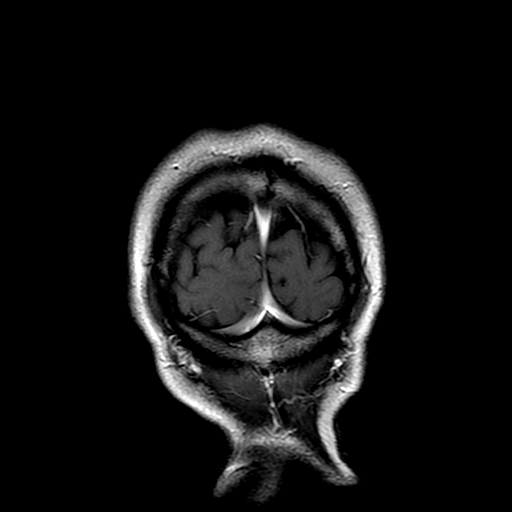
[im 24/24]
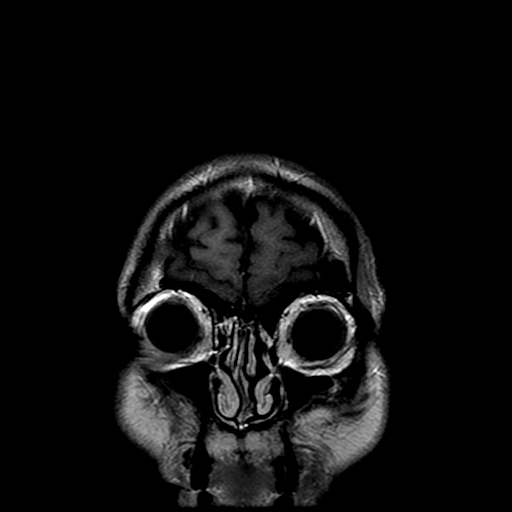

[Series 13: T1 post-contrast · sagittal · 5.0mm · 0.47mm/px · 2 of 24 slices shown (2 of 2)]
[im 1/24]
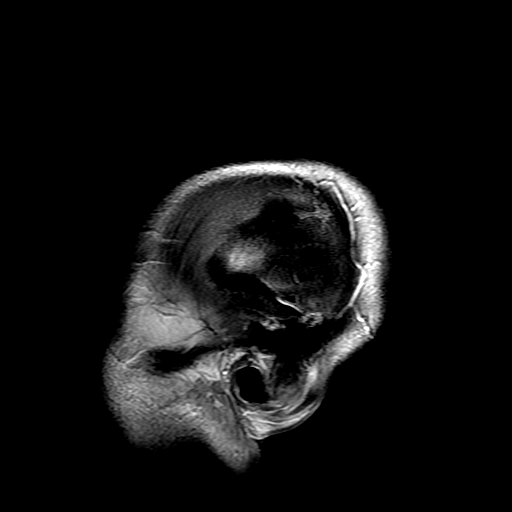
[im 24/24]
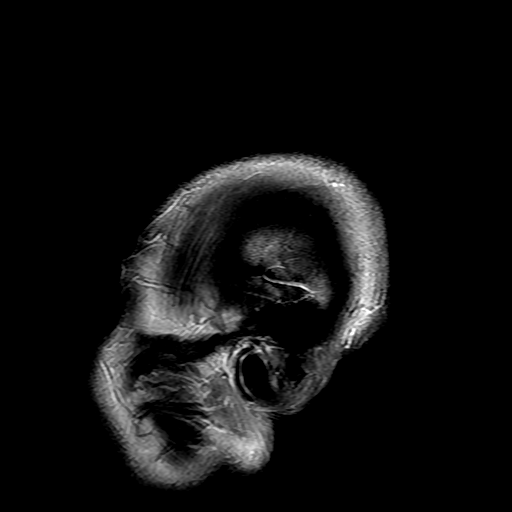

[Series 300: DWI · axial · 3.0mm · 1.09mm/px · z∈[-48,+86]mm · 4 of 46 slices shown (3 of 4)]
[im 1/46]
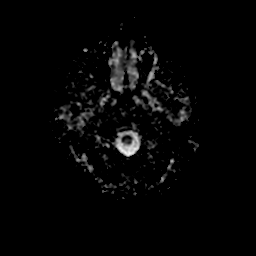
[im 16/46]
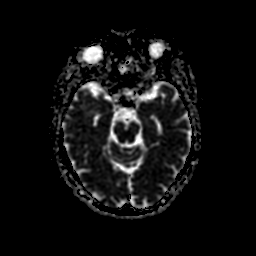
[im 31/46]
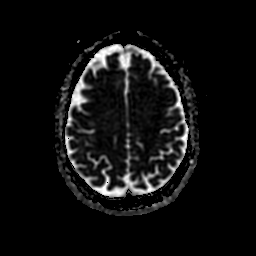
[im 46/46]
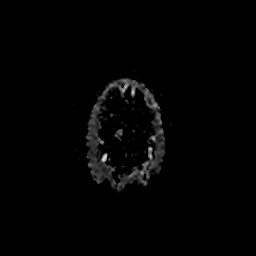

[Series 500: DWI · coronal · 3.0mm · 1.09mm/px · 4 of 50 slices shown (4 of 4)]
[im 1/50]
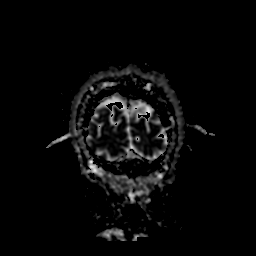
[im 17/50]
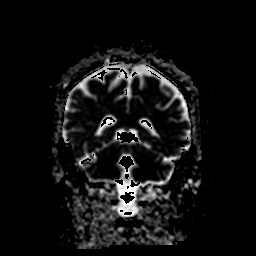
[im 33/50]
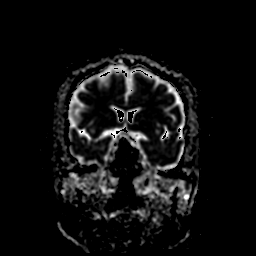
[im 50/50]
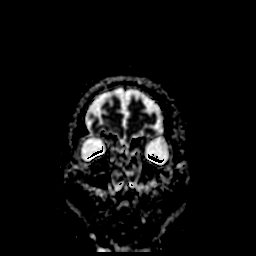

[36 of 48 positions shown; findings below may reference images not displayed]

FINDINGS: Brain: Mild cerebral atrophy. Negative for hydrocephalus. Negative
for acute or chronic infarct. Negative for hemorrhage mass or edema.
Normal enhancement postcontrast administration.

Vascular: Normal arterial flow voids

Skull and upper cervical spine: Negative

Sinuses/Orbits: Mild mucosal edema paranasal sinuses.  Normal orbit

Other: None
IMPRESSION: Mild cerebral atrophy without acute or focal abnormality.

Mild mucosal edema paranasal sinuses.

## 2020-07-02 ENCOUNTER — Other Ambulatory Visit: Payer: Self-pay | Admitting: Family Medicine

## 2020-07-02 DIAGNOSIS — R351 Nocturia: Secondary | ICD-10-CM

## 2020-07-02 DIAGNOSIS — I10 Essential (primary) hypertension: Secondary | ICD-10-CM

## 2020-07-02 DIAGNOSIS — N401 Enlarged prostate with lower urinary tract symptoms: Secondary | ICD-10-CM

## 2020-08-23 ENCOUNTER — Ambulatory Visit (INDEPENDENT_AMBULATORY_CARE_PROVIDER_SITE_OTHER): Payer: Medicare Other | Admitting: Family Medicine

## 2020-08-23 ENCOUNTER — Other Ambulatory Visit: Payer: Self-pay

## 2020-08-23 ENCOUNTER — Encounter: Payer: Self-pay | Admitting: Family Medicine

## 2020-08-23 VITALS — BP 132/76 | HR 64 | Temp 98.1°F | Resp 15 | Ht 69.0 in | Wt 165.2 lb

## 2020-08-23 DIAGNOSIS — R42 Dizziness and giddiness: Secondary | ICD-10-CM | POA: Diagnosis not present

## 2020-08-23 DIAGNOSIS — R351 Nocturia: Secondary | ICD-10-CM | POA: Diagnosis not present

## 2020-08-23 DIAGNOSIS — N529 Male erectile dysfunction, unspecified: Secondary | ICD-10-CM | POA: Diagnosis not present

## 2020-08-23 DIAGNOSIS — N401 Enlarged prostate with lower urinary tract symptoms: Secondary | ICD-10-CM | POA: Diagnosis not present

## 2020-08-23 DIAGNOSIS — D649 Anemia, unspecified: Secondary | ICD-10-CM | POA: Diagnosis not present

## 2020-08-23 DIAGNOSIS — Z0000 Encounter for general adult medical examination without abnormal findings: Secondary | ICD-10-CM | POA: Diagnosis not present

## 2020-08-23 DIAGNOSIS — Z23 Encounter for immunization: Secondary | ICD-10-CM

## 2020-08-23 DIAGNOSIS — Z1329 Encounter for screening for other suspected endocrine disorder: Secondary | ICD-10-CM | POA: Diagnosis not present

## 2020-08-23 DIAGNOSIS — S90211A Contusion of right great toe with damage to nail, initial encounter: Secondary | ICD-10-CM

## 2020-08-23 DIAGNOSIS — I10 Essential (primary) hypertension: Secondary | ICD-10-CM

## 2020-08-23 LAB — CBC WITH DIFFERENTIAL/PLATELET
Basophils Absolute: 0 10*3/uL (ref 0.0–0.1)
Basophils Relative: 0.5 % (ref 0.0–3.0)
Eosinophils Absolute: 0.2 10*3/uL (ref 0.0–0.7)
Eosinophils Relative: 5.6 % — ABNORMAL HIGH (ref 0.0–5.0)
HCT: 41.6 % (ref 39.0–52.0)
Hemoglobin: 13.6 g/dL (ref 13.0–17.0)
Lymphocytes Relative: 35.1 % (ref 12.0–46.0)
Lymphs Abs: 1.3 10*3/uL (ref 0.7–4.0)
MCHC: 32.7 g/dL (ref 30.0–36.0)
MCV: 84.4 fl (ref 78.0–100.0)
Monocytes Absolute: 0.4 10*3/uL (ref 0.1–1.0)
Monocytes Relative: 10.4 % (ref 3.0–12.0)
Neutro Abs: 1.7 10*3/uL (ref 1.4–7.7)
Neutrophils Relative %: 48.4 % (ref 43.0–77.0)
Platelets: 198 10*3/uL (ref 150.0–400.0)
RBC: 4.93 Mil/uL (ref 4.22–5.81)
RDW: 13.8 % (ref 11.5–15.5)
WBC: 3.6 10*3/uL — ABNORMAL LOW (ref 4.0–10.5)

## 2020-08-23 LAB — TSH: TSH: 0.94 u[IU]/mL (ref 0.35–4.50)

## 2020-08-23 LAB — COMPREHENSIVE METABOLIC PANEL
ALT: 13 U/L (ref 0–53)
AST: 22 U/L (ref 0–37)
Albumin: 4.2 g/dL (ref 3.5–5.2)
Alkaline Phosphatase: 53 U/L (ref 39–117)
BUN: 21 mg/dL (ref 6–23)
CO2: 28 mEq/L (ref 19–32)
Calcium: 9.7 mg/dL (ref 8.4–10.5)
Chloride: 103 mEq/L (ref 96–112)
Creatinine, Ser: 1.38 mg/dL (ref 0.40–1.50)
GFR: 52.02 mL/min — ABNORMAL LOW (ref >60.00)
Glucose, Bld: 87 mg/dL (ref 70–99)
Potassium: 4.1 mEq/L (ref 3.5–5.1)
Sodium: 140 mEq/L (ref 135–145)
Total Bilirubin: 0.7 mg/dL (ref 0.2–1.2)
Total Protein: 7.2 g/dL (ref 6.0–8.3)

## 2020-08-23 MED ORDER — SILDENAFIL CITRATE 100 MG PO TABS: ORAL_TABLET | ORAL | 5 refills | Status: DC

## 2020-08-23 MED ORDER — FINASTERIDE 5 MG PO TABS: 5.0000 mg | ORAL_TABLET | Freq: Every day | ORAL | 1 refills | Status: DC

## 2020-08-23 MED ORDER — LISINOPRIL 10 MG PO TABS: 5.0000 mg | ORAL_TABLET | Freq: Every day | ORAL | 3 refills | Status: DC

## 2020-08-23 NOTE — Addendum Note (Signed)
Addended by: Patrcia Dolly on: 08/23/2020 09:32 AM   Modules accepted: Orders

## 2020-08-23 NOTE — Progress Notes (Signed)
Subjective:  Patient ID: Darrell Wells, male    DOB: May 03, 1950  Age: 70 y.o. MRN: [id number]  CC:  Chief Complaint  Patient presents with  . Annual Exam    Pt here for a physical, doing well, had plantar fas. Noticed his big toe nail of the Rt foot has turned black on part of it. Would like this looked at but otherwise feels well no concerns.     HPI Darrell Wells presents for   Annual physical exam/wellness exam Care team Primary care provider, me ENT, Dr. Lucia Gaskins, treated for BPV. Podiatry, Dr. Paulla Dolly Gastroenterology, Dr. Loletha Carrow  Hypertension: Lisinopril 10 mg daily Home readings: 118-123/70's. No new side effects with meds.  BP Readings from Last 3 Encounters:  08/23/20 132/76  02/23/20 136/88  08/23/19 (!) 137/97   Anemia: Borderline in 2020. No dark stools. No iron supplements.  Lab Results  Component Value Date   WBC 4.4 (A) 11/24/2017   HGB 13.6 (A) 11/24/2017   HCT 43.1 (A) 11/24/2017   MCV 82.1 11/24/2017   PLT 218 05/09/2015   Toenail concern: Darkening of part of toenail past week or two - possibly from new running shoe. No pain. No bleeding/drainage. No treatment. Same size. No prior similar area. Has been sore in area at times.   Erectile dysfunction With history of BPH. Finasteride 5 mg daily for BPH with nocturia - working well.  Viagra 100 mg 1/2-1 as needed previously.  Working well at 50mg  dose.  Denies new headache, flushing, vision or hearing changes, no chest pain with exertion.  Lab Results  Component Value Date   CREATININE 1.21 02/23/2020   Fall Risk  08/23/2020 02/23/2020 08/23/2019 07/12/2019 02/03/2019  Falls in the past year? 0 0 0 0 0  Number falls in past yr: - - - - -  Injury with Fall? - - - - -  Risk for fall due to : History of fall(s) - - - -  Follow up Falls evaluation completed Falls evaluation completed Falls evaluation completed Falls evaluation completed Falls evaluation completed  lighting in home:adequate.  Loose  rugs:none Stairs: 2 flights with rails.  Grab bars in bathroom: none - planning on redesign with grab bar.   Depression screen [facility] 2/9 08/23/2020 02/23/2020 08/23/2019 07/12/2019 02/03/2019  Decreased Interest 0 0 0 0 0  Down, Depressed, Hopeless 0 0 0 0 0  PHQ - 2 Score 0 0 0 0 0   Cancer screening: Colonoscopy 06/09/18 Prostate: The natural history of prostate cancer and ongoing controversy regarding screening and potential treatment outcomes of prostate cancer has been discussed with the patient. The meaning of a false positive PSA and a false negative PSA has been discussed. He indicates understanding of the limitations of this screening test and wishes NOT to proceed with screening PSA testing.  Lab Results  Component Value Date   PSA1 0.5 07/10/2018   PSA1 0.5 05/26/2017   PSA 0.43 05/09/2015   PSA 0.50 05/03/2014   PSA 0.48 12/29/2012     Immunization History  Administered Date(s) Administered  . Fluad Quad(high Dose 65+) 01/11/2019, 02/23/2020  . Influenza, High Dose Seasonal PF 07/10/2018  . Influenza,inj,Quad PF,6+ Mos 05/03/2014, 05/09/2015, 04/18/2016, 05/26/2017  . PFIZER(Purple Top)SARS-COV-2 Vaccination 06/28/2019, 07/21/2019, 02/10/2020  . Pneumococcal Conjugate-13 05/23/2016  . Pneumococcal Polysaccharide-23 05/26/2017  shingrix: has not had - will receive today.    Functional Status Survey: Is the patient deaf or have difficulty hearing?: No Does the patient have difficulty seeing, even when  wearing glasses/contacts?: No Does the patient have difficulty concentrating, remembering, or making decisions?: No Does the patient have difficulty walking or climbing stairs?: No Does the patient have difficulty dressing or bathing?: No Does the patient have difficulty doing errands alone such as visiting a doctor's office or shopping?: No  6CIT Screen 08/23/2020 08/23/2019 07/12/2019 08/18/2018 07/10/2018  What Year? 0 points 0 points 0 points 0 points 0 points  What month?  0 points 0 points 0 points 0 points 0 points  What time? 0 points 0 points 0 points 0 points 0 points  Count back from 20 0 points 0 points 0 points 0 points 0 points  Months in reverse 0 points 0 points 0 points 0 points 0 points  Repeat phrase 0 points 0 points 0 points 0 points 0 points  Total Score 0 0 0 0 0    Flowsheet Row Office Visit from 08/23/2020 in [place]  AUDIT-C Score 2      Hearing Screening   125Hz  250Hz  500Hz  1000Hz  2000Hz  3000Hz  4000Hz  6000Hz  8000Hz   Right ear:           Left ear:             Visual Acuity Screening   Right eye Left eye Both eyes  Without correction: 20/13 20/10 20/10   With correction:     no recent optho visit. No corrective lenses.   Dental: every 6 months.   Exercise: 5-6 days per week, 1-1.5 hrs.   Advanced directives: has living will and HCPOA.     History Patient Active Problem List   Diagnosis Date Noted  . Essential hypertension, benign 05/03/2014  . BPH (benign prostatic hyperplasia) 05/03/2014  . Erectile dysfunction 05/03/2014   Past Medical History:  Diagnosis Date  . Enlarged prostate   . Hypertension   . Vertigo    Past Surgical History:  Procedure Laterality Date  . 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 REPAIR KNEE LIGAMENT Left    No Known Allergies Prior to Admission medications   Medication Sig Start Date End Date Taking? Authorizing Provider  diclofenac (VOLTAREN) 75 MG EC tablet TAKE 1 TABLET(75 MG) BY MOUTH TWICE DAILY 07/14/19   Wallene Huh, DPM  finasteride (PROSCAR) 5 MG tablet Take 1 tablet (5 mg total) by mouth daily. 02/23/20   [name], MD  lisinopril (ZESTRIL) 10 MG tablet Take 0.5 tablets (5 mg total) by mouth daily. 02/23/20   [name], MD  meclizine (ANTIVERT) 25 MG tablet Take 1 tablet (25 mg total) by mouth 3 (three) times daily as needed for dizziness. 02/23/20   [name], MD  sildenafil (VIAGRA) 100  MG tablet TAKE 1/2 TO 1 TABLET BY MOUTH AS NEEDED FOR ERECTILE DYSFUNCTION 04/23/20   [name], MD   Social History   Socioeconomic History  . Marital status: Single    Spouse name: Not on file  . Number of children: Not on file  . Years of education: Not on file  . Highest education level: Not on file  Occupational History  . Not on file  Tobacco Use  . Smoking status: Never Smoker  . Smokeless tobacco: Never Used  Vaping Use  . Vaping Use: Never used  Substance and Sexual Activity  . Alcohol use: Yes    Comment: rare  . Drug use: No  . Sexual activity: Yes  Other Topics Concern  . Not on file  Social History Narrative  . Not on file   Social Determinants of Health   Financial Resource Strain: Not on file  Food Insecurity: Not on file  Transportation Needs: Not on file  Physical Activity: Not on file  Stress: Not on file  Social Connections: Not on file  Intimate Partner Violence: Not on file    Review of Systems  Constitutional: Negative for fatigue and unexpected weight change.  Eyes: Negative for visual disturbance.  Respiratory: Negative for cough, chest tightness and shortness of breath.   Cardiovascular: Negative for chest pain, palpitations and leg swelling.  Gastrointestinal: Negative for abdominal pain and blood in stool.  Neurological: Negative for dizziness, light-headedness and headaches.   13 point review of systems per patient health survey noted.  Negative other than as indicated above or in HPI.    Objective:   Vitals:   08/23/20 0817  BP: 132/76  Pulse: 64  Resp: 15  Temp: 98.1 F (36.7 C)  TempSrc: Temporal  SpO2: 99%  Weight: 165 lb 3.2 oz (74.9 kg)  Height: 5\' 9"  (1.753 m)     Physical Exam Vitals reviewed.  Constitutional:      Appearance: He is well-developed.  HENT:     Head: Normocephalic and atraumatic.     Right Ear: External ear normal.     Left Ear: External ear normal.  Eyes:     Conjunctiva/sclera:  Conjunctivae normal.     Pupils: Pupils are equal, round, and reactive to light.  Neck:     Thyroid: No thyromegaly.  Cardiovascular:     Rate and Rhythm: Normal rate and regular rhythm.     Heart sounds: Normal heart sounds.  Pulmonary:     Effort: Pulmonary effort is normal. No respiratory distress.     Breath sounds: Normal breath sounds. No wheezing.  Abdominal:     General: There is no distension.     Palpations: Abdomen is soft.     Tenderness: There is no abdominal tenderness.  Musculoskeletal:        General: No tenderness. Normal range of motion.     Cervical back: Normal range of motion and neck supple.     Comments: R great toe with dark center - see photo. Nontender, no d/c.   Lymphadenopathy:     Cervical: No cervical adenopathy.  Skin:    General: Skin is warm and dry.  Neurological:     Mental Status: He is alert and oriented to person, place, and time.     Deep Tendon Reflexes: Reflexes are normal and symmetric.  Psychiatric:        Behavior: Behavior normal.        Assessment & Plan:  Darrell Wells is a 70 y.o. male . Medicare annual wellness visit, subsequent  - - anticipatory guidance as below in AVS, screening labs if needed. Health maintenance items as above in HPI discussed/recommended as applicable.  - no concerning responses on depression, fall, or functional status screening. Any positive responses noted as above. Advanced directives discussed as in CH[facility].   Erectile dysfunction, unspecified erectile dysfunction type  - viagra Rx given - use lowest effective dose. Side effects discussed (including but not limited to headache/flushing, blue discoloration of vision, possible vascular steal and risk of cardiac effects if underlying unknown coronary artery disease, and permanent sensorineural hearing loss). Understanding expressed.  Vertigo  - followed by ENT  Essential hypertension, benign  -  Stable, tolerating current regimen. Medications refilled.  Labs pending as  above.   Benign prostatic hyperplasia with nocturia  -  Stable, tolerating current regimen. Medications refilled. Labs pending as above.   Screening for thyroid disorder  - check tsh.  Anemia, unspecified type  - borderline on prior testing, asymptomatic. Check CBC.   Subungual hematoma of great toe of right foot, initial encounter  -Suspected subungual hematoma, less likely from melanoma.  Close follow-up next few weeks if not improving, monitor footwear.  RTC precautions.  Meds ordered this encounter  Medications  . sildenafil (VIAGRA) 100 MG tablet    Sig: TAKE 1/2 TO 1 TABLET BY MOUTH AS NEEDED FOR ERECTILE DYSFUNCTION    Dispense:  25 tablet    Refill:  5  . finasteride (PROSCAR) 5 MG tablet    Sig: Take 1 tablet (5 mg total) by mouth daily.    Dispense:  90 tablet    Refill:  1  . lisinopril (ZESTRIL) 10 MG tablet    Sig: Take 0.5 tablets (5 mg total) by mouth daily.    Dispense:  45 tablet    Refill:  3   Patient Instructions   If darkened area under nail not improving in next few weeks, follow up to discuss other evaluation. Thanks for coming in today.    Subungual Hematoma A subungual hematoma, sometimes called runner's toe or tennis toe, is a collection of blood under a fingernail or toenail. It can cause pain and a dark blue area under the nail. What are the causes? This condition is caused by an injury to a finger or toe that breaks a blood vessel beneath the nail. It can develop from:  A hard, direct hit to a finger or toe (crush injury).  Pressure being repeatedly put on a finger or toe, such as pressure on a toe from running or playing tennis. What are the signs or symptoms? Symptoms of this condition include:  A blue or dark blue color under the nail.  Pain or throbbing in the injured area.   How is this diagnosed? This condition is diagnosed with a medical history and a physical exam. X-rays may be done to check for damage to the  surrounding bones and tissues. How is this treated? Usually, treatment is not needed for this condition. The pain often goes away in a few days, and the dark color under the nail will go away as the nail grows. If the injury is more severe, your health care provider may:  Perform a painless procedure to drain the blood from beneath the nail. This may be done if the condition is causing a lot of pain or if a lot of blood collects under the fingernail or toenail.  Remove the nail. This may be needed if there is a cut under the nail that requires stitches (sutures). Follow these instructions at home: Managing pain, stiffness, and swelling  If directed, apply ice to the injured area: ? Put ice in a plastic bag. ? Place a towel between your skin and the bag. ? Leave the ice on for 20 minutes, 2-3 times a day.  Raise (elevate) the injured finger or toe above the level of your heart while you are sitting or lying down. This will help to decrease pain and swelling.   Injury care  Follow instructions from your health care provider about how to take care of your injury. Make sure you: ? Change any bandage (dressing) as told by your health care provider. ? Wash your hands with soap and water before  you change your dressing. If soap and water are not available, use hand sanitizer. ? Leave stitches (sutures) in place. You may have these if your health care provider repaired a cut under the nail. The sutures may need to stay in place for 2 weeks or longer.  If part of your nail falls off, gently trim the remaining nail. This prevents the remaining nail from catching on something and causing further injury. General instructions  Take over-the-counter and prescription medicines only as told by your health care provider.  Return to your normal activities as told by your health care provider. Ask your health care provider what activities are safe for you.  Keep all follow-up visits as told by your  health care provider. This is important. Contact a health care provider if you have:  Pain that is not controlled with medicine.  A fever.  Redness, swelling, or pain around your nail. Get help right away if you have:  Fluid, blood, or pus coming from your nail. Summary  A subungual hematoma is a collection of blood under a fingernail or toenail.  This condition is typically caused by a crush injury or an injury from repeatedly putting stress on a finger or toe.  The condition causes pain and a dark blue area under the nail.  A subungual hematoma will usually go away on its own.  In some cases, a health care provider may drain the blood from underneath the nail. This information is not intended to replace advice given to you by your health care provider. Make sure you discuss any questions you have with your health care provider. Document Revised: 09/18/2017 Document Reviewed: 09/18/2017 Elsevier Patient Education  [place] 65 Years and Older, Male Preventive care refers to lifestyle choices and visits with your health care provider that can promote health and wellness. This includes:  A yearly physical exam. This is also called an annual wellness visit.  Regular dental and eye exams.  Immunizations.  Screening for certain conditions.  Healthy lifestyle choices, such as: ? Eating a healthy diet. ? Getting regular exercise. ? Not using drugs or products that contain nicotine and tobacco. ? Limiting alcohol use. What can I expect for my preventive care visit? Physical exam Your health care provider will check your:  Height and weight. These may be used to calculate your BMI (body mass index). BMI is a measurement that tells if you are at a healthy weight.  Heart rate and blood pressure.  Body temperature.  Skin for abnormal spots. Counseling Your health care provider may ask you questions about your:  Past medical  problems.  Family's medical history.  Alcohol, tobacco, and drug use.  Emotional well-being.  Home life and relationship well-being.  Sexual activity.  Diet, exercise, and sleep habits.  History of falls.  Memory and ability to understand (cognition).  Work and work Statistician.  Access to firearms. What immunizations do I need? Vaccines are usually given at various ages, according to a schedule. Your health care provider will recommend vaccines for you based on your age, medical history, and lifestyle or other factors, such as travel or where you work.   What tests do I need? Blood tests  Lipid and cholesterol levels. These may be checked every 5 years, or more often depending on your overall health.  Hepatitis C test.  Hepatitis B test. Screening  Lung cancer screening. You may have this screening every year starting at age 81 if you  have a 30-pack-year history of smoking and currently smoke or have quit within the past 15 years.  Colorectal cancer screening. ? All adults should have this screening starting at age 70 and continuing until age 90. ? Your health care provider may recommend screening at age 40 if you are at increased risk. ? You will have tests every 1-10 years, depending on your results and the type of screening test.  Prostate cancer screening. Recommendations will vary depending on your family history and other risks.  Genital exam to check for testicular cancer or hernias.  Diabetes screening. ? This is done by checking your blood sugar (glucose) after you have not eaten for a while (fasting). ? You may have this done every 1-3 years.  Abdominal aortic aneurysm (AAA) screening. You may need this if you are a current or former smoker.  STD (sexually transmitted disease) testing, if you are at risk. Follow these instructions at home: Eating and drinking  Eat a diet that includes fresh fruits and vegetables, whole grains, lean protein, and low-fat  dairy products. Limit your intake of foods with high amounts of sugar, saturated fats, and salt.  Take vitamin and mineral supplements as recommended by your health care provider.  Do not drink alcohol if your health care provider tells you not to drink.  If you drink alcohol: ? Limit how much you have to 0-2 drinks a day. ? Be aware of how much alcohol is in your drink. In the [place] one drink equals one 12 oz bottle of beer (355 mL), one 5 oz glass of wine (148 mL), or one 1 oz glass of hard liquor (44 mL).   Lifestyle  Take daily care of your teeth and gums. Brush your teeth every morning and night with fluoride toothpaste. Floss one time each day.  Stay active. Exercise for at least 30 minutes 5 or more days each week.  Do not use any products that contain nicotine or tobacco, such as cigarettes, e-cigarettes, and chewing tobacco. If you need help quitting, ask your health care provider.  Do not use drugs.  If you are sexually active, practice safe sex. Use a condom or other form of protection to prevent STIs (sexually transmitted infections).  Talk with your health care provider about taking a low-dose aspirin or statin.  Find healthy ways to cope with stress, such as: ? Meditation, yoga, or listening to music. ? Journaling. ? Talking to a trusted person. ? Spending time with friends and family. Safety  Always wear your seat belt while driving or riding in a vehicle.  Do not drive: ? If you have been drinking alcohol. Do not ride with someone who has been drinking. ? When you are tired or distracted. ? While texting.  Wear a helmet and other protective equipment during sports activities.  If you have firearms in your house, make sure you follow all gun safety procedures. What's next?  Visit your health care provider once a year for an annual wellness visit.  Ask your health care provider how often you should have your eyes and teeth checked.  Stay up to date on all  vaccines. This information is not intended to replace advice given to you by your health care provider. Make sure you discuss any questions you have with your health care provider. Document Revised: 01/12/2019 Document Reviewed: 04/09/2018 Elsevier Patient Education  2021 [place].      Signed, Merri Ray, MD [facility]

## 2020-08-23 NOTE — Patient Instructions (Signed)
If darkened area under nail not improving in next few weeks, follow up to discuss other evaluation. Thanks for coming in today.    Subungual Hematoma A subungual hematoma, sometimes called runner's toe or tennis toe, is a collection of blood under a fingernail or toenail. It can cause pain and a dark blue area under the nail. What are the causes? This condition is caused by an injury to a finger or toe that breaks a blood vessel beneath the nail. It can develop from:  A hard, direct hit to a finger or toe (crush injury).  Pressure being repeatedly put on a finger or toe, such as pressure on a toe from running or playing tennis. What are the signs or symptoms? Symptoms of this condition include:  A blue or dark blue color under the nail.  Pain or throbbing in the injured area.   How is this diagnosed? This condition is diagnosed with a medical history and a physical exam. X-rays may be done to check for damage to the surrounding bones and tissues. How is this treated? Usually, treatment is not needed for this condition. The pain often goes away in a few days, and the dark color under the nail will go away as the nail grows. If the injury is more severe, your health care provider may:  Perform a painless procedure to drain the blood from beneath the nail. This may be done if the condition is causing a lot of pain or if a lot of blood collects under the fingernail or toenail.  Remove the nail. This may be needed if there is a cut under the nail that requires stitches (sutures). Follow these instructions at home: Managing pain, stiffness, and swelling  If directed, apply ice to the injured area: ? Put ice in a plastic bag. ? Place a towel between your skin and the bag. ? Leave the ice on for 20 minutes, 2-3 times a day.  Raise (elevate) the injured finger or toe above the level of your heart while you are sitting or lying down. This will help to decrease pain and swelling.   Injury  care  Follow instructions from your health care provider about how to take care of your injury. Make sure you: ? Change any bandage (dressing) as told by your health care provider. ? Wash your hands with soap and water before you change your dressing. If soap and water are not available, use hand sanitizer. ? Leave stitches (sutures) in place. You may have these if your health care provider repaired a cut under the nail. The sutures may need to stay in place for 2 weeks or longer.  If part of your nail falls off, gently trim the remaining nail. This prevents the remaining nail from catching on something and causing further injury. General instructions  Take over-the-counter and prescription medicines only as told by your health care provider.  Return to your normal activities as told by your health care provider. Ask your health care provider what activities are safe for you.  Keep all follow-up visits as told by your health care provider. This is important. Contact a health care provider if you have:  Pain that is not controlled with medicine.  A fever.  Redness, swelling, or pain around your nail. Get help right away if you have:  Fluid, blood, or pus coming from your nail. Summary  A subungual hematoma is a collection of blood under a fingernail or toenail.  This condition is typically caused  by a crush injury or an injury from repeatedly putting stress on a finger or toe.  The condition causes pain and a dark blue area under the nail.  A subungual hematoma will usually go away on its own.  In some cases, a health care provider may drain the blood from underneath the nail. This information is not intended to replace advice given to you by your health care provider. Make sure you discuss any questions you have with your health care provider. Document Revised: 09/18/2017 Document Reviewed: 09/18/2017 Elsevier Patient Education  [place] 70 Years  and Older, Male Preventive care refers to lifestyle choices and visits with your health care provider that can promote health and wellness. This includes:  A yearly physical exam. This is also called an annual wellness visit.  Regular dental and eye exams.  Immunizations.  Screening for certain conditions.  Healthy lifestyle choices, such as: ? Eating a healthy diet. ? Getting regular exercise. ? Not using drugs or products that contain nicotine and tobacco. ? Limiting alcohol use. What can I expect for my preventive care visit? Physical exam Your health care provider will check your:  Height and weight. These may be used to calculate your BMI (body mass index). BMI is a measurement that tells if you are at a healthy weight.  Heart rate and blood pressure.  Body temperature.  Skin for abnormal spots. Counseling Your health care provider may ask you questions about your:  Past medical problems.  Family's medical history.  Alcohol, tobacco, and drug use.  Emotional well-being.  Home life and relationship well-being.  Sexual activity.  Diet, exercise, and sleep habits.  History of falls.  Memory and ability to understand (cognition).  Work and work Statistician.  Access to firearms. What immunizations do I need? Vaccines are usually given at various ages, according to a schedule. Your health care provider will recommend vaccines for you based on your age, medical history, and lifestyle or other factors, such as travel or where you work.   What tests do I need? Blood tests  Lipid and cholesterol levels. These may be checked every 5 years, or more often depending on your overall health.  Hepatitis C test.  Hepatitis B test. Screening  Lung cancer screening. You may have this screening every year starting at age 70 if you have a 30-pack-year history of smoking and currently smoke or have quit within the past 15 years.  Colorectal cancer screening. ? All  adults should have this screening starting at age 70 and continuing until age 70. ? Your health care provider may recommend screening at age 70 if you are at increased risk. ? You will have tests every 1-10 years, depending on your results and the type of screening test.  Prostate cancer screening. Recommendations will vary depending on your family history and other risks.  Genital exam to check for testicular cancer or hernias.  Diabetes screening. ? This is done by checking your blood sugar (glucose) after you have not eaten for a while (fasting). ? You may have this done every 1-3 years.  Abdominal aortic aneurysm (AAA) screening. You may need this if you are a current or former smoker.  STD (sexually transmitted disease) testing, if you are at risk. Follow these instructions at home: Eating and drinking  Eat a diet that includes fresh fruits and vegetables, whole grains, lean protein, and low-fat dairy products. Limit your intake of foods with high amounts of sugar,  saturated fats, and salt.  Take vitamin and mineral supplements as recommended by your health care provider.  Do not drink alcohol if your health care provider tells you not to drink.  If you drink alcohol: ? Limit how much you have to 0-2 drinks a day. ? Be aware of how much alcohol is in your drink. In the [place] one drink equals one 12 oz bottle of beer (355 mL), one 5 oz glass of wine (148 mL), or one 1 oz glass of hard liquor (44 mL).   Lifestyle  Take daily care of your teeth and gums. Brush your teeth every morning and night with fluoride toothpaste. Floss one time each day.  Stay active. Exercise for at least 30 minutes 5 or more days each week.  Do not use any products that contain nicotine or tobacco, such as cigarettes, e-cigarettes, and chewing tobacco. If you need help quitting, ask your health care provider.  Do not use drugs.  If you are sexually active, practice safe sex. Use a condom or other form  of protection to prevent STIs (sexually transmitted infections).  Talk with your health care provider about taking a low-dose aspirin or statin.  Find healthy ways to cope with stress, such as: ? Meditation, yoga, or listening to music. ? Journaling. ? Talking to a trusted person. ? Spending time with friends and family. Safety  Always wear your seat belt while driving or riding in a vehicle.  Do not drive: ? If you have been drinking alcohol. Do not ride with someone who has been drinking. ? When you are tired or distracted. ? While texting.  Wear a helmet and other protective equipment during sports activities.  If you have firearms in your house, make sure you follow all gun safety procedures. What's next?  Visit your health care provider once a year for an annual wellness visit.  Ask your health care provider how often you should have your eyes and teeth checked.  Stay up to date on all vaccines. This information is not intended to replace advice given to you by your health care provider. Make sure you discuss any questions you have with your health care provider. Document Revised: 01/12/2019 Document Reviewed: 04/09/2018 Elsevier Patient Education  2021 [employer].

## 2020-11-06 ENCOUNTER — Other Ambulatory Visit: Payer: Self-pay | Admitting: Family Medicine

## 2020-11-06 DIAGNOSIS — N529 Male erectile dysfunction, unspecified: Secondary | ICD-10-CM

## 2020-11-08 ENCOUNTER — Telehealth: Payer: Self-pay | Admitting: Family Medicine

## 2020-11-08 NOTE — Telephone Encounter (Signed)
..  Medication Refills  Last OV:  Medication: Sildenafil  Pharmacy:  [facility] Let patient know to contact pharmacy at the end of the day to make sure medication is ready.   Please notify patient to allow 48-72 hours to process.  Encourage patient to contact the pharmacy for refills or they can request refills through [place] out below:   Last refill:  QTY:  Refill Date:    Other Comments:   Okay for refill?  Please advise.

## 2020-11-08 NOTE — Telephone Encounter (Signed)
Called patient and informed of refills on file. Patient will reach out to his pharmacy.

## 2021-02-21 ENCOUNTER — Encounter: Payer: Self-pay | Admitting: Family Medicine

## 2021-02-21 ENCOUNTER — Ambulatory Visit: Payer: Medicare Other | Admitting: Family Medicine

## 2021-02-21 ENCOUNTER — Telehealth (INDEPENDENT_AMBULATORY_CARE_PROVIDER_SITE_OTHER): Payer: Medicare Other | Admitting: Family Medicine

## 2021-02-21 DIAGNOSIS — N529 Male erectile dysfunction, unspecified: Secondary | ICD-10-CM | POA: Diagnosis not present

## 2021-02-21 DIAGNOSIS — I10 Essential (primary) hypertension: Secondary | ICD-10-CM

## 2021-02-21 DIAGNOSIS — R351 Nocturia: Secondary | ICD-10-CM | POA: Diagnosis not present

## 2021-02-21 DIAGNOSIS — N401 Enlarged prostate with lower urinary tract symptoms: Secondary | ICD-10-CM

## 2021-02-21 DIAGNOSIS — R42 Dizziness and giddiness: Secondary | ICD-10-CM | POA: Diagnosis not present

## 2021-02-21 MED ORDER — FINASTERIDE 5 MG PO TABS: 5.0000 mg | ORAL_TABLET | Freq: Every day | ORAL | 1 refills | Status: DC

## 2021-02-21 MED ORDER — SILDENAFIL CITRATE 100 MG PO TABS: ORAL_TABLET | ORAL | 5 refills | Status: DC

## 2021-02-21 MED ORDER — MECLIZINE HCL 25 MG PO TABS: 25.0000 mg | ORAL_TABLET | Freq: Three times a day (TID) | ORAL | 2 refills | Status: DC | PRN

## 2021-02-21 MED ORDER — LISINOPRIL 10 MG PO TABS: 5.0000 mg | ORAL_TABLET | Freq: Every day | ORAL | 3 refills | Status: DC

## 2021-02-21 NOTE — Patient Instructions (Signed)
No med changes for now. We will schedule a lab visit and then follow up with me in 6 months.   Let me know if any questions.

## 2021-02-21 NOTE — Progress Notes (Signed)
Virtual Visit via Video Note  I connected with [name] on 02/21/21 at 11:02 AM by a video enabled telemedicine application and verified that I am speaking with the correct person using two identifiers.  Patient location: hotel in [facility] - alone.  My location: office - [place]   I discussed the limitations, risks, security and privacy concerns of performing an evaluation and management service by telephone and the availability of in person appointments. I also discussed with the patient that there may be a patient responsible charge related to this service. The patient expressed understanding and agreed to proceed, consent obtained  Chief complaint:  Chief Complaint  Patient presents with   Medication Refill    Pt reports viagra works at 1/2 tablet but reports he has not been receiving 25 tablets only 5 from pharmacy, will call to verify    Hypertension    Pt in need of refill no concerns doing well denies physical sx      History of Present Illness: Darrell Wells is a 70 y.o. male  Hypertension: Lisinopril 5mg  qd. No new side effects  Home readings: 120/70-80 range.  Chronic hx vertigo - meclizine if needed has worked well.  Constitutional: Negative for fatigue and unexpected weight change.  Eyes: Negative for visual disturbance.  Respiratory: Negative for cough, chest tightness and shortness of breath.   Cardiovascular: Negative for chest pain, palpitations and leg swelling.  Gastrointestinal: Negative for abdominal pain and blood in stool.  Neurological: Negative for new dizziness, light-headedness and headaches.     BP Readings from Last 3 Encounters:  08/23/20 132/76  02/23/20 136/88  08/23/19 (!) 137/97   Lab Results  Component Value Date   CREATININE 1.38 08/23/2020   Erectile dysfunction Treated with Viagra - taking 50mg , effective. denies any new vision difficulties or blue-colored vision, hearing changes or chest pain/dyspnea with  exertion.  BPH: Takes finasteride, working well. Normal urination, nocturia - 1-2 at the most. Some none.   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 Authorizing Provider  finasteride (PROSCAR) 5 MG tablet Take 1 tablet (5 mg total) by mouth daily. 08/23/20   [name], MD  lisinopril (ZESTRIL) 10 MG tablet Take 0.5 tablets (5 mg total) by mouth daily. 08/23/20   [name], MD  meclizine (ANTIVERT) 25 MG tablet Take 1 tablet (25 mg total) by mouth 3 (three) times daily as needed for dizziness. 02/23/20   [name], MD  sildenafil (VIAGRA) 100 MG tablet TAKE 1/2 TO 1 TABLET BY MOUTH AS NEEDED FOR ERECTILE DYSFUNCTION 08/23/20   [name], MD   Social History   Socioeconomic History   Marital status: Single    Spouse name: Not on file   Number of children: Not on file   Years of education: Not on file   Highest education level: Not on file  Occupational History   Not on file  Tobacco Use   Smoking status: Never   Smokeless tobacco: Never  Vaping Use   Vaping Use: Never used  Substance and Sexual Activity   Alcohol use: Yes    Comment: rare   Drug use: No   Sexual activity: Yes  Other Topics Concern   Not on file  Social History Narrative   Not on file   Social Determinants of Health   Financial Resource Strain: Not on file  Food Insecurity: Not on file  Transportation Needs: Not on file  Physical Activity: Not on file  Stress: Not on file  Social Connections: Not on file  Intimate  Partner Violence: Not on file    Observations/Objective: There were no vitals filed for this visit. Nontoxic appearance. Speaking in full sentences, no respiratory distress. Euthymic mood, affect mood congruent.  All questions answered.   Assessment and Plan: Benign prostatic hyperplasia with nocturia - Plan: finasteride (PROSCAR) 5 MG tablet  -  Stable, tolerating current regimen. Medications refilled.  Essential hypertension, benign - Plan: lisinopril (ZESTRIL) 10 MG tablet, Basic metabolic panel  -  Stable, tolerating current regimen. Medications refilled. Labs pending as above - lab visit.   Vertigo - Plan: meclizine (ANTIVERT) 25 MG tablet  - continue prn meclizine.   Erectile dysfunction, unspecified erectile dysfunction type - Plan: sildenafil (VIAGRA) 100 MG tablet  - viagra Rx given - use lowest effective dose. Side effects discussed (including but not limited to headache/flushing, blue discoloration of vision, possible vascular steal and risk of cardiac effects if underlying unknown coronary artery disease, and permanent sensorineural hearing loss). Understanding expressed.    Follow Up Instructions: Lab visit then 6 months in person.    I discussed the assessment and treatment plan with the patient. The patient was provided an opportunity to ask questions and all were answered. The patient agreed with the plan and demonstrated an understanding of the instructions.   The patient was advised to call back or seek an in-person evaluation if the symptoms worsen or if the condition fails to improve as anticipated.   [name], MD

## 2021-03-05 ENCOUNTER — Telehealth: Payer: Self-pay

## 2021-03-05 NOTE — Telephone Encounter (Signed)
Caller name:Cullan Bulson   On DPR? :Yes  Call back number:[phone]  Provider they see: [name]   Reason for call:Pt is calling was told he should be getting 25 and he is only getting 5 a month sildenafil (VIAGRA) 100 MG tablet  pt is wanting to know if this number could be increased?    [place], [place] [facility] [place]

## 2021-03-06 NOTE — Telephone Encounter (Signed)
Called and left a message for patient to call office back about Rx concern.

## 2021-03-06 NOTE — Telephone Encounter (Signed)
Patient returned call and stated that pharmacy was only dispensing 5 tablets a month. I informed him that his pcp has been sending in 25 tablets and this may be certainly an insurance issue. Patient stated that he will reach out to his insurance company to figure out what is happening.

## 2021-07-25 ENCOUNTER — Encounter: Payer: Self-pay | Admitting: Family Medicine

## 2021-07-25 ENCOUNTER — Ambulatory Visit (INDEPENDENT_AMBULATORY_CARE_PROVIDER_SITE_OTHER): Payer: Medicare Other | Admitting: Family Medicine

## 2021-07-25 VITALS — BP 128/80 | HR 77 | Temp 98.1°F | Resp 15 | Ht 69.0 in | Wt 163.0 lb

## 2021-07-25 DIAGNOSIS — R0982 Postnasal drip: Secondary | ICD-10-CM | POA: Diagnosis not present

## 2021-07-25 DIAGNOSIS — R052 Subacute cough: Secondary | ICD-10-CM | POA: Diagnosis not present

## 2021-07-25 DIAGNOSIS — R42 Dizziness and giddiness: Secondary | ICD-10-CM

## 2021-07-25 MED ORDER — FLUTICASONE PROPIONATE 50 MCG/ACT NA SUSP: 2.0000 | Freq: Every day | NASAL | 6 refills | Status: DC

## 2021-07-25 NOTE — Progress Notes (Signed)
? ?Subjective:  ?Patient ID: Darrell Wells, male    DOB: 08-08-1950  Age: 71 y.o. MRN: [id number] ? ?CC:  ?Chief Complaint  ?Patient presents with  ? Dizziness  ?  Pt notes long standing vertigo issue notes this has been worse recently lasts longer than it used to notes BP has been good in home readings   ? Cough  ?  Notes nagging dry cough, started about 6 weeks, will come and go week to week has taken several COVID tests over weeks all negative   ? ? ?HPI ?Darrell Wells presents for  ? ?Dizziness/vertigo: ?Hx of vertigo. Treated with meclizine as needed in the past. More frequent, feels off balance more lately. Room spinning feeling - more frequent past 6 weeks  ?Rare HA in past - 2 days of HA in past 6 weeks went away next day.  ?No chest pain, palpitations.  ?No focal weakness or slurred speech.  ?ENT Dr. Lucia Gaskins - appt in 2021 noted.  Planned vestibular function testing. Has not had performed.  ?MRI brain in 2019 for HA: ?IMPRESSION: ?Mild cerebral atrophy without acute or focal abnormality. ?Mild mucosal edema paranasal sinuses. ? ?Taking meclizine '25mg'$  once per day, then up to every 8 hours with flare of vertigo with worse sx last week. Better now.  ?Notes dizziness with leaning forward, not with stooping/squatting.   ?Drinking plenty of fluids.  ?Theraflu, mucinex -past 2 months for cough  ?Has taken Viagra for erectile dysfunction, finasteride for BPH. ? ?Cough: ?Off and on for 6 weeks.  Multiple negative COVID tests ?Dry cough.some postnasal drip, usually clear.  ?No heartburn. ?Hoarse at times. Trouble with singing  ?No current allergy treatments.  ?Takes lisinopril 5 mg daily for hypertension. Same med for years.  ?No fever, weight loss or night sweats.  ?Zyrtec in past - no se's. Not recent.  ? ?Hypertension: ? ?Home readings: ?BP Readings from Last 3 Encounters:  ?07/25/21 128/80  ?08/23/20 132/76  ?02/23/20 136/88  ? ?Lab Results  ?Component Value Date  ? CREATININE 1.38 08/23/2020   ? ? ? ? ?History ?Patient Active Problem List  ? Diagnosis Date Noted  ? Essential hypertension, benign 05/03/2014  ? BPH (benign prostatic hyperplasia) 05/03/2014  ? Erectile dysfunction 05/03/2014  ? ?Past Medical History:  ?Diagnosis Date  ? Enlarged prostate   ? Hypertension   ? Vertigo   ? ?Past Surgical History:  ?Procedure Laterality Date  ? NEPHRECTOMY Right 1995  ? 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 REPAIR KNEE LIGAMENT Left   ? ?No Known Allergies ?Prior to Admission medications   ?Medication Sig Start Date End Date Taking? Authorizing Provider  ?finasteride (PROSCAR) 5 MG tablet Take 1 tablet (5 mg total) by mouth daily. 02/21/21  Yes [name], MD  ?lisinopril (ZESTRIL) 10 MG tablet Take 0.5 tablets (5 mg total) by mouth daily. 02/21/21  Yes [name], MD  ?meclizine (ANTIVERT) 25 MG tablet Take 1 tablet (25 mg total) by mouth 3 (three) times daily as needed for dizziness. 02/21/21  Yes [name], MD  ?sildenafil (VIAGRA) 100 MG tablet TAKE 1/2 TO 1 TABLET BY MOUTH AS NEEDED FOR ERECTILE DYSFUNCTION 02/21/21  Yes [name], MD  ? ?Social History  ? ?Socioeconomic History  ? Marital status: Single  ?  Spouse name: Not on file  ? Number of children: Not on file  ? Years of education: Not on file  ? Highest education level: Not on file  ?Occupational History  ?  Not on file  ?Tobacco Use  ? Smoking status: Never  ? Smokeless tobacco: Never  ?Vaping Use  ? Vaping Use: Never used  ?Substance and Sexual Activity  ? Alcohol use: Yes  ?  Comment: rare  ? Drug use: No  ? Sexual activity: Yes  ?Other Topics Concern  ? Not on file  ?Social History Narrative  ? Not on file  ? ?Social Determinants of Health  ? ?Financial Resource Strain: Not on file  ?Food Insecurity: Not on file  ?Transportation Needs: Not on file  ?Physical Activity: Not on file  ?Stress: Not on file  ?Social Connections: Not on file  ?Intimate Partner Violence: Not on file  ? ? ?Review of Systems ? ?Per HPI.  ?Objective:   ? ?Vitals:  ? 07/25/21 1355  ?BP: 128/80  ?Pulse: 77  ?Resp: 15  ?Temp: 98.1 ?F (36.7 ?C)  ?TempSrc: Temporal  ?SpO2: 99%  ?Weight: 163 lb (73.9 kg)  ?Height: '5\' 9"'$  (1.753 m)  ? ? ? ? ? ?Physical Exam ?Vitals reviewed.  ?Constitutional:   ?   Appearance: He is well-developed.  ?HENT:  ?   Head: Normocephalic and atraumatic.  ?   Nose: No congestion or rhinorrhea.  ?   Mouth/Throat:  ?   Mouth: Mucous membranes are moist.  ?Eyes:  ?   Extraocular Movements: Extraocular movements intact.  ?   Pupils: Pupils are equal, round, and reactive to light.  ?   Comments: Few beats of horizontal nystagmus to the right with looking right, reproduces vertigo/dizziness symptoms.  No vertical or rotational component.  ?Neck:  ?   Vascular: No carotid bruit or JVD.  ?Cardiovascular:  ?   Rate and Rhythm: Normal rate and regular rhythm.  ?   Heart sounds: Normal heart sounds. No murmur heard. ?Pulmonary:  ?   Effort: Pulmonary effort is normal.  ?   Breath sounds: Normal breath sounds. No rales.  ?Musculoskeletal:  ?   Right lower leg: No edema.  ?   Left lower leg: No edema.  ?Skin: ?   General: Skin is warm and dry.  ?Neurological:  ?   General: No focal deficit present.  ?   Mental Status: He is alert and oriented to person, place, and time.  ?   Cranial Nerves: No dysarthria or facial asymmetry.  ?   Motor: Motor function is intact. No weakness, tremor or pronator drift.  ?   Coordination: Coordination is intact. Romberg sign negative. Coordination normal. Finger-Nose-Finger Test normal. Rapid alternating movements normal.  ?   Gait: Gait is intact.  ?Psychiatric:     ?   Mood and Affect: Mood normal.  ? ? ? ? ? ?Assessment & Plan:  ?Darrell Wells is a 71 y.o. male . ?Vertigo - Plan: Ambulatory referral to ENT ? -Longstanding symptoms with some increased frequency of symptoms recently.  Flare last week that has improved.  Nonfocal neurologic exam.  Has had longstanding vertigo for years, including prior to MRI in 2019.  Few  isolated headaches without persistent or recurrent headache.  Asymptomatic at present. ? -We will refer back to ENT as it appears other testing was planned.  Continue meclizine for now with ER/RTC precautions. ? ?Subacute cough - Plan: fluticasone (FLONASE) 50 MCG/ACT nasal spray ?PND (post-nasal drip) - Plan: fluticasone (FLONASE) 50 MCG/ACT nasal spray ? -Possible postnasal drip as cause.  Less likely ACE inhibitor cough.  Trial of Flonase plus or minus Zyrtec, recheck next few weeks -  if not improving could consider temporary change from ACE inhibitor. ?Rtc precautions.  ? ?Meds ordered this encounter  ?Medications  ? fluticasone (FLONASE) 50 MCG/ACT nasal spray  ?  Sig: Place 2 sprays into both nostrils daily.  ?  Dispense:  16 g  ?  Refill:  6  ? ?Patient Instructions  ?Start flonase, with option to add zyrtec if postnasal drip not improved.  Cough may be from the postnasal drip which may be from allergies. ?Recheck in 2 weeks if cough not resolved.  ?For the vertigo, I will refer you to ENT. Continue meclizine for now for vertigo. If headaches return let me know.  ?Return to the clinic or go to the nearest emergency room if any of your symptoms worsen or new symptoms occur. ? ?Vertigo ?Vertigo is the feeling that you or your surroundings are moving when they are not. This feeling can come and go at any time. Vertigo often goes away on its own. Vertigo can be dangerous if it occurs while you are doing something that could endanger yourself or others, such as driving or operating machinery. ?Your health care provider will do tests to try to determine the cause of your vertigo. Tests will also help your health care provider decide how best to treat your condition. ?Follow these instructions at home: ?Eating and drinking ?  ?Dehydration can make vertigo worse. Drink enough fluid to keep your urine pale yellow. ?Do not drink alcohol. ?Activity ?Return to your normal activities as told by your health care provider.  Ask your health care provider what activities are safe for you. ?In the morning, first sit up on the side of the bed. When you feel okay, stand slowly while you hold onto something until you know that your balance is fine. ?

## 2021-07-25 NOTE — Patient Instructions (Addendum)
Start flonase, with option to add zyrtec if postnasal drip not improved.  Cough may be from the postnasal drip which may be from allergies. ?Recheck in 2 weeks if cough not resolved.  ?For the vertigo, I will refer you to ENT. Continue meclizine for now for vertigo. If headaches return let me know.  ?Return to the clinic or go to the nearest emergency room if any of your symptoms worsen or new symptoms occur. ? ?Vertigo ?Vertigo is the feeling that you or your surroundings are moving when they are not. This feeling can come and go at any time. Vertigo often goes away on its own. Vertigo can be dangerous if it occurs while you are doing something that could endanger yourself or others, such as driving or operating machinery. ?Your health care provider will do tests to try to determine the cause of your vertigo. Tests will also help your health care provider decide how best to treat your condition. ?Follow these instructions at home: ?Eating and drinking ?  ?Dehydration can make vertigo worse. Drink enough fluid to keep your urine pale yellow. ?Do not drink alcohol. ?Activity ?Return to your normal activities as told by your health care provider. Ask your health care provider what activities are safe for you. ?In the morning, first sit up on the side of the bed. When you feel okay, stand slowly while you hold onto something until you know that your balance is fine. ?Move slowly. Avoid sudden body or head movements or certain positions, as told by your health care provider. ?If you have trouble walking or keeping your balance, try using a cane for stability. If you feel dizzy or unstable, sit down right away. ?Avoid doing any tasks that would cause danger to you or others if vertigo occurs. ?Avoid bending down if you feel dizzy. Place items in your home so that they are easy for you to reach without bending or leaning over. ?Do not drive or use machinery if you feel dizzy. ?General instructions ?Take over-the-counter  and prescription medicines only as told by your health care provider. ?Keep all follow-up visits. This is important. ?Contact a health care provider if: ?Your medicines do not relieve your vertigo or they make it worse. ?Your condition gets worse or you develop new symptoms. ?You have a fever. ?You develop nausea or vomiting, or if nausea gets worse. ?Your family or friends notice any behavioral changes. ?You have numbness or a prickling and tingling sensation in part of your body. ?Get help right away if you: ?Are always dizzy or you faint. ?Develop severe headaches. ?Develop a stiff neck. ?Develop sensitivity to light. ?Have difficulty moving or speaking. ?Have weakness in your hands, arms, or legs. ?Have changes in your hearing or vision. ?These symptoms may represent a serious problem that is an emergency. Do not wait to see if the symptoms will go away. Get medical help right away. Call your local emergency services (911 in the [place] Do not drive yourself to the hospital. ?Summary ?Vertigo is the feeling that you or your surroundings are moving when they are not. ?Your health care provider will do tests to try to determine the cause of your vertigo. ?Follow instructions for home care. You may be told to avoid certain tasks, positions, or movements. ?Contact a health care provider if your medicines do not relieve your symptoms, or if you have a fever, nausea, vomiting, or changes in behavior. ?Get help right away if you have severe headaches or difficulty speaking, or  you develop hearing or vision problems. ?This information is not intended to replace advice given to you by your health care provider. Make sure you discuss any questions you have with your health care provider. ?Document Revised: 03/15/2020 Document Reviewed: 03/15/2020 ?Elsevier Patient Education ? 2022 [place]. ? ? ? ?Cough, Adult ?Coughing is a reflex that clears your throat and your airways (respiratory system). Coughing helps to heal  and protect your lungs. It is normal to cough occasionally, but a cough that happens with other symptoms or lasts a long time may be a sign of a condition that needs treatment. An acute cough may only last 2-3 weeks, while a chronic cough may last 8 or more weeks. ?Coughing is commonly caused by: ?Infection of the respiratory systemby viruses or bacteria. ?Breathing in substances that irritate your lungs. ?Allergies. ?Asthma. ?Mucus that runs down the back of your throat (postnasal drip). ?Smoking. ?Acid backing up from the stomach into the esophagus (gastroesophageal reflux). ?Certain medicines. ?Chronic lung problems. ?Other medical conditions such as heart failure or a blood clot in the lung (pulmonary embolism). ?Follow these instructions at home: ?Medicines ?Take over-the-counter and prescription medicines only as told by your health care provider. ?Talk with your health care provider before you take a cough suppressant medicine. ?Lifestyle ? ?Avoid cigarette smoke. Do not use any products that contain nicotine or tobacco, such as cigarettes, e-cigarettes, and chewing tobacco. If you need help quitting, ask your health care provider. ?Drink enough fluid to keep your urine pale yellow. ?Avoid caffeine. ?Do not drink alcohol if your health care provider tells you not to drink. ?General instructions ? ?Pay close attention to changes in your cough. Tell your health care provider about them. ?Always cover your mouth when you cough. ?Avoid things that make you cough, such as perfume, candles, cleaning products, or campfire or tobacco smoke. ?If the air is dry, use a cool mist vaporizer or humidifier in your bedroom or your home to help loosen secretions. ?If your cough is worse at night, try to sleep in a semi-upright position. ?Rest as needed. ?Keep all follow-up visits as told by your health care provider. This is important. ?Contact a health care provider if you: ?Have new symptoms. ?Cough up pus. ?Have a cough  that does not get better after 2-3 weeks or gets worse. ?Cannot control your cough with cough suppressant medicines and you are losing sleep. ?Have pain that gets worse or pain that is not helped with medicine. ?Have a fever. ?Have unexplained weight loss. ?Have night sweats. ?Get help right away if: ?You cough up blood. ?You have difficulty breathing. ?Your heartbeat is very fast. ?These symptoms may represent a serious problem that is an emergency. Do not wait to see if the symptoms will go away. Get medical help right away. Call your local emergency services (911 in the [place] Do not drive yourself to the hospital. ?Summary ?Coughing is a reflex that clears your throat and your airways. It is normal to cough occasionally, but a cough that happens with other symptoms or lasts a long time may be a sign of a condition that needs treatment. ?Take over-the-counter and prescription medicines only as told by your health care provider. ?Always cover your mouth when you cough. ?Contact a health care provider if you have new symptoms or a cough that does not get better after 2-3 weeks or gets worse. ?This information is not intended to replace advice given to you by your health care provider. Make sure  you discuss any questions you have with your health care provider. ?Document Revised: 05/04/2018 Document Reviewed: 05/04/2018 ?Elsevier Patient Education ? 2022 [place]. ? ? ?

## 2021-08-09 ENCOUNTER — Other Ambulatory Visit: Payer: Self-pay | Admitting: Family Medicine

## 2021-08-09 DIAGNOSIS — N401 Enlarged prostate with lower urinary tract symptoms: Secondary | ICD-10-CM

## 2021-09-11 DIAGNOSIS — R42 Dizziness and giddiness: Secondary | ICD-10-CM | POA: Diagnosis not present

## 2021-09-14 ENCOUNTER — Encounter: Payer: Self-pay | Admitting: Family Medicine

## 2021-09-14 ENCOUNTER — Ambulatory Visit (INDEPENDENT_AMBULATORY_CARE_PROVIDER_SITE_OTHER): Payer: Medicare Other | Admitting: Family Medicine

## 2021-09-14 VITALS — BP 128/64 | HR 63 | Temp 97.9°F | Resp 16 | Ht 69.0 in | Wt 157.4 lb

## 2021-09-14 DIAGNOSIS — Z0000 Encounter for general adult medical examination without abnormal findings: Secondary | ICD-10-CM

## 2021-09-14 DIAGNOSIS — R351 Nocturia: Secondary | ICD-10-CM | POA: Diagnosis not present

## 2021-09-14 DIAGNOSIS — I10 Essential (primary) hypertension: Secondary | ICD-10-CM | POA: Diagnosis not present

## 2021-09-14 DIAGNOSIS — N529 Male erectile dysfunction, unspecified: Secondary | ICD-10-CM

## 2021-09-14 DIAGNOSIS — Z131 Encounter for screening for diabetes mellitus: Secondary | ICD-10-CM

## 2021-09-14 DIAGNOSIS — N401 Enlarged prostate with lower urinary tract symptoms: Secondary | ICD-10-CM

## 2021-09-14 LAB — COMPREHENSIVE METABOLIC PANEL
ALT: 14 U/L (ref 0–53)
AST: 23 U/L (ref 0–37)
Albumin: 4.3 g/dL (ref 3.5–5.2)
Alkaline Phosphatase: 58 U/L (ref 39–117)
BUN: 16 mg/dL (ref 6–23)
CO2: 29 mEq/L (ref 19–32)
Calcium: 9.9 mg/dL (ref 8.4–10.5)
Chloride: 103 mEq/L (ref 96–112)
Creatinine, Ser: 1.33 mg/dL (ref 0.40–1.50)
GFR: 53.97 mL/min — ABNORMAL LOW (ref >60.00)
Glucose, Bld: 88 mg/dL (ref 70–99)
Potassium: 4.4 mEq/L (ref 3.5–5.1)
Sodium: 138 mEq/L (ref 135–145)
Total Bilirubin: 0.6 mg/dL (ref 0.2–1.2)
Total Protein: 7.6 g/dL (ref 6.0–8.3)

## 2021-09-14 LAB — LIPID PANEL
Cholesterol: 199 mg/dL (ref 0–200)
HDL: 73.8 mg/dL (ref >39.00)
LDL Cholesterol: 107 mg/dL — ABNORMAL HIGH (ref 0–99)
NonHDL: 125.4
Total CHOL/HDL Ratio: 3
Triglycerides: 93 mg/dL (ref 0.0–149.0)
VLDL: 18.6 mg/dL (ref 0.0–40.0)

## 2021-09-14 MED ORDER — FINASTERIDE 5 MG PO TABS: ORAL_TABLET | ORAL | 3 refills | Status: DC

## 2021-09-14 MED ORDER — LISINOPRIL 10 MG PO TABS: 5.0000 mg | ORAL_TABLET | Freq: Every day | ORAL | 3 refills | Status: DC

## 2021-09-14 NOTE — Progress Notes (Signed)
Subjective:  Patient ID: Darrell Wells, male    DOB: 09/27/1950  Age: 71 y.o. MRN: [id number]  CC:  Chief Complaint  Patient presents with   Annual Exam    Pt here for annual, is fasting for lab work, no concerns     HPI Darrell Wells presents for Annual Exam  Hypertension: Lisinopril 5 mg daily (1/2 of 10).  Home readings: 120-130/80 range.  No med side effects.  BP Readings from Last 3 Encounters:  09/14/21 128/64  07/25/21 128/80  08/23/20 132/76   Lab Results  Component Value Date   CREATININE 1.38 08/23/2020   BPH Proscar 5 mg daily - doing well. Nocturia 0-1 per night,  stream ok. No new sx's, working well.   Erectile dysfunction Viagra 50 to 100 mg as needed. Using half pill intermittently. No vision/hearing changes. No HA or flushing, no CP with exertion/intercourse.   Flonase for allergies  Vertigo, treated with as needed meclizine, prior eval with ENT, note reviewed from Dr. Redmond Baseman eval May 16.  Plan for initial audiometric testing, then possible vestibular testing. Hearing test next Tuesday planned.  Meclizine working ok - sometimes up to every day - helps when off balance.       07/25/2021    1:59 PM 08/23/2020    8:20 AM 02/23/2020    7:55 AM 08/23/2019   11:18 AM 07/12/2019    8:09 AM  Depression screen PHQ 2/9  Decreased Interest 0 0 0 0 0  Down, Depressed, Hopeless 0 0 0 0 0  PHQ - 2 Score 0 0 0 0 0    Health Maintenance  Topic Date Due   COVID-19 Vaccine (4 - Booster for Pfizer series) 09/30/2021 (Originally 04/06/2020)   Zoster Vaccines- Shingrix (2 of 2) 10/25/2021 (Originally 10/18/2020)   TETANUS/TDAP  09/15/2022 (Originally 02/09/2020)   INFLUENZA VACCINE  11/27/2021   Pneumonia Vaccine 30+ Years old  Completed   Hepatitis C Screening  Completed   HPV VACCINES  Aged Out  Colon: colonoscopy 06/09/18 - no repeat planned.  71yo brother with pancreatic CA last month, had surgery, chemo - doing ok.  Prostate: does NOT have family history of  prostate cancer The natural history of prostate cancer and ongoing controversy regarding screening and potential treatment outcomes of prostate cancer has been discussed with the patient. The meaning of a false positive PSA and a false negative PSA has been discussed. He indicates understanding of the limitations of this screening test and wishes NOT to proceed with screening PSA testing, with age over 34.  Lab Results  Component Value Date   PSA1 0.5 07/10/2018   PSA1 0.5 05/26/2017   PSA 0.43 05/09/2015   PSA 0.50 05/03/2014   PSA 0.48 12/29/2012     Immunization History  Administered Date(s) Administered   Fluad Quad(high Dose 65+) 01/11/2019, 02/23/2020   Influenza, High Dose Seasonal PF 07/10/2018   Influenza,inj,Quad PF,6+ Mos 05/03/2014, 05/09/2015, 04/18/2016, 05/26/2017   PFIZER(Purple Top)SARS-COV-2 Vaccination 06/28/2019, 07/21/2019, 02/10/2020   Pneumococcal Conjugate-13 05/23/2016   Pneumococcal Polysaccharide-23 05/26/2017   Zoster Recombinat (Shingrix) 08/23/2020  Shingrix - recommended at his pharmacy.   No results found. Last optho eval few years ago. No corrective lenses. No new vision symptoms.   Dental:Yes overdue. Plans to schedule  Alcohol: 6 drinks per year.   Tobacco: none  Exercise: running, lifting around house, yardwork, some weights. At least 122mn per week. No cp with exercise.    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 Authorizing Provider  finasteride (PROSCAR) 5 MG tablet  TAKE 1 TABLET(5 MG) BY MOUTH DAILY 08/10/21  Yes [name], MD  fluticasone [facility]) 50 MCG/ACT nasal spray Place 2 sprays into both nostrils daily. 07/25/21  Yes [name], MD  lisinopril (ZESTRIL) 10 MG tablet Take 0.5 tablets (5 mg total) by mouth daily. 02/21/21  Yes [name], MD  meclizine (ANTIVERT) 25 MG tablet Take 1 tablet (25 mg total) by mouth 3 (three) times daily as needed for dizziness. 02/21/21  Yes [name], MD  sildenafil (VIAGRA) 100 MG tablet TAKE 1/2 TO 1 TABLET BY MOUTH AS NEEDED FOR ERECTILE DYSFUNCTION 02/21/21  Yes [name], MD   Social History   Socioeconomic History   Marital status: Single    Spouse name: Not on file   Number of children: Not on file   Years of education: Not on file   Highest education level: Not on file  Occupational History   Not on file  Tobacco Use   Smoking status: Never   Smokeless tobacco: Never  Vaping Use   Vaping Use: Never used  Substance and Sexual Activity   Alcohol use: Yes    Comment: rare   Drug use: No   Sexual activity: Yes  Other Topics Concern   Not on file  Social History Narrative   Not on file   Social Determinants of Health   Financial Resource Strain: Not on file  Food Insecurity: Not on file  Transportation Needs: Not on file  Physical Activity: Not on file  Stress: Not on file  Social Connections: Not on file  Intimate Partner Violence: Not on file    Review of Systems 13 point review of systems per patient health survey noted.  Negative other than as indicated above or in HPI.    Objective:   Vitals:   09/14/21 0929  BP: 128/64  Pulse: 63  Resp: 16  Temp: 97.9 F (36.6 C)  TempSrc: Temporal  SpO2: 99%  Weight: 157 lb 6.4 oz (71.4 kg)  Height: '5\' 9"'$  (1.753 m)     Physical Exam Vitals reviewed.  Constitutional:      Appearance: He is well-developed.  HENT:     Head: Normocephalic and atraumatic.     Right Ear: External ear normal.      Left Ear: External ear normal.  Eyes:     Conjunctiva/sclera: Conjunctivae normal.     Pupils: Pupils are equal, round, and reactive to light.  Neck:     Thyroid: No thyromegaly.     Vascular: No carotid bruit or JVD.  Cardiovascular:     Rate and Rhythm: Normal rate and regular rhythm.     Heart sounds: Normal heart sounds. No murmur heard. Pulmonary:     Effort: Pulmonary effort is normal. No respiratory distress.     Breath sounds: Normal breath sounds. No wheezing or rales.  Abdominal:     General: There is no distension.  Palpations: Abdomen is soft.     Tenderness: There is no abdominal tenderness.  Musculoskeletal:        General: No tenderness. Normal range of motion.     Cervical back: Normal range of motion and neck supple.     Right lower leg: No edema.     Left lower leg: No edema.  Lymphadenopathy:     Cervical: No cervical adenopathy.  Skin:    General: Skin is warm and dry.  Neurological:     Mental Status: He is alert and oriented to person, place, and time.     Deep Tendon Reflexes: Reflexes are normal and symmetric.  Psychiatric:        Mood and Affect: Mood normal.        Behavior: Behavior normal.     Assessment & Plan:  Darrell Wells is a 71 y.o. male . Annual physical exam  - -anticipatory guidance as below in AVS, screening labs above. Health maintenance items as above in HPI discussed/recommended as applicable. Optho, dental recommended, 2nd shingrix and bivalent covid booster.   Essential hypertension, benign  - well controlled. Continue same dose lisinopril.   Erectile dysfunction, unspecified erectile dysfunction type  - viagra - use lowest effective dose. Side effects discussed (including but not limited to headache/flushing, blue discoloration of vision, possible vascular steal and risk of cardiac effects if underlying unknown coronary artery disease, and permanent sensorineural hearing loss). Understanding expressed.  Benign prostatic  hyperplasia with nocturia  - stable. Continue proscar  No orders of the defined types were placed in this encounter.  Patient Instructions  Schedule 2nd shingles vaccine at your pharmacy.  Recommend updated covid bivalent booster at your pharmacy.  I do recommend appointment with eye specialist for eye screening.  Call and schedule dentist visit.  Thanks for coming in today.  Preventive Care 22 Years and Older, Male Preventive care refers to lifestyle choices and visits with your health care provider that can promote health and wellness. Preventive care visits are also called wellness exams. What can I expect for my preventive care visit? Counseling During your preventive care visit, your health care provider may ask about your: Medical history, including: Past medical problems. Family medical history. History of falls. Current health, including: Emotional well-being. Home life and relationship well-being. Sexual activity. Memory and ability to understand (cognition). Lifestyle, including: Alcohol, nicotine or tobacco, and drug use. Access to firearms. Diet, exercise, and sleep habits. Work and work Statistician. Sunscreen use. Safety issues such as seatbelt and bike helmet use. Physical exam Your health care provider will check your: Height and weight. These may be used to calculate your BMI (body mass index). BMI is a measurement that tells if you are at a healthy weight. Waist circumference. This measures the distance around your waistline. This measurement also tells if you are at a healthy weight and may help predict your risk of certain diseases, such as type 2 diabetes and high blood pressure. Heart rate and blood pressure. Body temperature. Skin for abnormal spots. What immunizations do I need?  Vaccines are usually given at various ages, according to a schedule. Your health care provider will recommend vaccines for you based on your age, medical history, and lifestyle  or other factors, such as travel or where you work. What tests do I need? Screening Your health care provider may recommend screening tests for certain conditions. This may include: Lipid and cholesterol levels. Diabetes screening. This is done by checking your blood sugar (glucose)  after you have not eaten for a while (fasting). Hepatitis C test. Hepatitis B test. HIV (human immunodeficiency virus) test. STI (sexually transmitted infection) testing, if you are at risk. Lung cancer screening. Colorectal cancer screening. Prostate cancer screening. Abdominal aortic aneurysm (AAA) screening. You may need this if you are a current or former smoker. Talk with your health care provider about your test results, treatment options, and if necessary, the need for more tests. Follow these instructions at home: Eating and drinking  Eat a diet that includes fresh fruits and vegetables, whole grains, lean protein, and low-fat dairy products. Limit your intake of foods with high amounts of sugar, saturated fats, and salt. Take vitamin and mineral supplements as recommended by your health care provider. Do not drink alcohol if your health care provider tells you not to drink. If you drink alcohol: Limit how much you have to 0-2 drinks a day. Know how much alcohol is in your drink. In the [place] one drink equals one 12 oz bottle of beer (355 mL), one 5 oz glass of wine (148 mL), or one 1 oz glass of hard liquor (44 mL). Lifestyle Brush your teeth every morning and night with fluoride toothpaste. Floss one time each day. Exercise for at least 30 minutes 5 or more days each week. Do not use any products that contain nicotine or tobacco. These products include cigarettes, chewing tobacco, and vaping devices, such as e-cigarettes. If you need help quitting, ask your health care provider. Do not use drugs. If you are sexually active, practice safe sex. Use a condom or other form of protection to prevent  STIs. Take aspirin only as told by your health care provider. Make sure that you understand how much to take and what form to take. Work with your health care provider to find out whether it is safe and beneficial for you to take aspirin daily. Ask your health care provider if you need to take a cholesterol-lowering medicine (statin). Find healthy ways to manage stress, such as: Meditation, yoga, or listening to music. Journaling. Talking to a trusted person. Spending time with friends and family. Safety Always wear your seat belt while driving or riding in a vehicle. Do not drive: If you have been drinking alcohol. Do not ride with someone who has been drinking. When you are tired or distracted. While texting. If you have been using any mind-altering substances or drugs. Wear a helmet and other protective equipment during sports activities. If you have firearms in your house, make sure you follow all gun safety procedures. Minimize exposure to UV radiation to reduce your risk of skin cancer. What's next? Visit your health care provider once a year for an annual wellness visit. Ask your health care provider how often you should have your eyes and teeth checked. Stay up to date on all vaccines. This information is not intended to replace advice given to you by your health care provider. Make sure you discuss any questions you have with your health care provider. Document Revised: 10/11/2020 Document Reviewed: 10/11/2020 Elsevier Patient Education  [place],   Merri Ray, MD [place], [facility] 09/14/21 10:16 AM

## 2021-09-14 NOTE — Patient Instructions (Addendum)
Schedule 2nd shingles vaccine at your pharmacy.  Recommend updated covid bivalent booster at your pharmacy.  I do recommend appointment with eye specialist for eye screening.  Call and schedule dentist visit when possible.  Thanks for coming in today.  Preventive Care 48 Years and Older, Male Preventive care refers to lifestyle choices and visits with your health care provider that can promote health and wellness. Preventive care visits are also called wellness exams. What can I expect for my preventive care visit? Counseling During your preventive care visit, your health care provider may ask about your: Medical history, including: Past medical problems. Family medical history. History of falls. Current health, including: Emotional well-being. Home life and relationship well-being. Sexual activity. Memory and ability to understand (cognition). Lifestyle, including: Alcohol, nicotine or tobacco, and drug use. Access to firearms. Diet, exercise, and sleep habits. Work and work Statistician. Sunscreen use. Safety issues such as seatbelt and bike helmet use. Physical exam Your health care provider will check your: Height and weight. These may be used to calculate your BMI (body mass index). BMI is a measurement that tells if you are at a healthy weight. Waist circumference. This measures the distance around your waistline. This measurement also tells if you are at a healthy weight and may help predict your risk of certain diseases, such as type 2 diabetes and high blood pressure. Heart rate and blood pressure. Body temperature. Skin for abnormal spots. What immunizations do I need?  Vaccines are usually given at various ages, according to a schedule. Your health care provider will recommend vaccines for you based on your age, medical history, and lifestyle or other factors, such as travel or where you work. What tests do I need? Screening Your health care provider may recommend  screening tests for certain conditions. This may include: Lipid and cholesterol levels. Diabetes screening. This is done by checking your blood sugar (glucose) after you have not eaten for a while (fasting). Hepatitis C test. Hepatitis B test. HIV (human immunodeficiency virus) test. STI (sexually transmitted infection) testing, if you are at risk. Lung cancer screening. Colorectal cancer screening. Prostate cancer screening. Abdominal aortic aneurysm (AAA) screening. You may need this if you are a current or former smoker. Talk with your health care provider about your test results, treatment options, and if necessary, the need for more tests. Follow these instructions at home: Eating and drinking  Eat a diet that includes fresh fruits and vegetables, whole grains, lean protein, and low-fat dairy products. Limit your intake of foods with high amounts of sugar, saturated fats, and salt. Take vitamin and mineral supplements as recommended by your health care provider. Do not drink alcohol if your health care provider tells you not to drink. If you drink alcohol: Limit how much you have to 0-2 drinks a day. Know how much alcohol is in your drink. In the [place] one drink equals one 12 oz bottle of beer (355 mL), one 5 oz glass of wine (148 mL), or one 1 oz glass of hard liquor (44 mL). Lifestyle Brush your teeth every morning and night with fluoride toothpaste. Floss one time each day. Exercise for at least 30 minutes 5 or more days each week. Do not use any products that contain nicotine or tobacco. These products include cigarettes, chewing tobacco, and vaping devices, such as e-cigarettes. If you need help quitting, ask your health care provider. Do not use drugs. If you are sexually active, practice safe sex. Use a condom or other form  of protection to prevent STIs. Take aspirin only as told by your health care provider. Make sure that you understand how much to take and what form to  take. Work with your health care provider to find out whether it is safe and beneficial for you to take aspirin daily. Ask your health care provider if you need to take a cholesterol-lowering medicine (statin). Find healthy ways to manage stress, such as: Meditation, yoga, or listening to music. Journaling. Talking to a trusted person. Spending time with friends and family. Safety Always wear your seat belt while driving or riding in a vehicle. Do not drive: If you have been drinking alcohol. Do not ride with someone who has been drinking. When you are tired or distracted. While texting. If you have been using any mind-altering substances or drugs. Wear a helmet and other protective equipment during sports activities. If you have firearms in your house, make sure you follow all gun safety procedures. Minimize exposure to UV radiation to reduce your risk of skin cancer. What's next? Visit your health care provider once a year for an annual wellness visit. Ask your health care provider how often you should have your eyes and teeth checked. Stay up to date on all vaccines. This information is not intended to replace advice given to you by your health care provider. Make sure you discuss any questions you have with your health care provider. Document Revised: 10/11/2020 Document Reviewed: 10/11/2020 Elsevier Patient Education  [employer].

## 2021-10-18 ENCOUNTER — Ambulatory Visit (INDEPENDENT_AMBULATORY_CARE_PROVIDER_SITE_OTHER): Payer: Medicare Other

## 2021-10-18 DIAGNOSIS — Z0000 Encounter for general adult medical examination without abnormal findings: Secondary | ICD-10-CM | POA: Diagnosis not present

## 2021-10-18 NOTE — Patient Instructions (Signed)
Darrell Wells , Thank you for taking time to come for your Medicare Wellness Visit. I appreciate your ongoing commitment to your health goals. Please review the following plan we discussed and let me know if I can assist you in the future.   Screening recommendations/referrals: Colonoscopy: 06/09/2018 Recommended yearly ophthalmology/optometry visit for glaucoma screening and checkup Recommended yearly dental visit for hygiene and checkup  Vaccinations: Influenza vaccine: completed  Pneumococcal vaccine: completed  Tdap vaccine: due  Shingles vaccine: completed     Advanced directives: yes   Conditions/risks identified: none   Next appointment: none   Preventive Care 71 Years and Older, Male Preventive care refers to lifestyle choices and visits with your health care provider that can promote health and wellness. What does preventive care include? A yearly physical exam. This is also called an annual well check. Dental exams once or twice a year. Routine eye exams. Ask your health care provider how often you should have your eyes checked. Personal lifestyle choices, including: Daily care of your teeth and gums. Regular physical activity. Eating a healthy diet. Avoiding tobacco and drug use. Limiting alcohol use. Practicing safe sex. Taking low doses of aspirin every day. Taking vitamin and mineral supplements as recommended by your health care provider. What happens during an annual well check? The services and screenings done by your health care provider during your annual well check will depend on your age, overall health, lifestyle risk factors, and family history of disease. Counseling  Your health care provider may ask you questions about your: Alcohol use. Tobacco use. Drug use. Emotional well-being. Home and relationship well-being. Sexual activity. Eating habits. History of falls. Memory and ability to understand (cognition). Work and work Statistician. Screening   You may have the following tests or measurements: Height, weight, and BMI. Blood pressure. Lipid and cholesterol levels. These may be checked every 5 years, or more frequently if you are over 11 years old. Skin check. Lung cancer screening. You may have this screening every year starting at age 30 if you have a 30-pack-year history of smoking and currently smoke or have quit within the past 15 years. Fecal occult blood test (FOBT) of the stool. You may have this test every year starting at age 22. Flexible sigmoidoscopy or colonoscopy. You may have a sigmoidoscopy every 5 years or a colonoscopy every 10 years starting at age 56. Prostate cancer screening. Recommendations will vary depending on your family history and other risks. Hepatitis C blood test. Hepatitis B blood test. Sexually transmitted disease (STD) testing. Diabetes screening. This is done by checking your blood sugar (glucose) after you have not eaten for a while (fasting). You may have this done every 1-3 years. Abdominal aortic aneurysm (AAA) screening. You may need this if you are a current or former smoker. Osteoporosis. You may be screened starting at age 19 if you are at high risk. Talk with your health care provider about your test results, treatment options, and if necessary, the need for more tests. Vaccines  Your health care provider may recommend certain vaccines, such as: Influenza vaccine. This is recommended every year. Tetanus, diphtheria, and acellular pertussis (Tdap, Td) vaccine. You may need a Td booster every 10 years. Zoster vaccine. You may need this after age 40. Pneumococcal 13-valent conjugate (PCV13) vaccine. One dose is recommended after age 75. Pneumococcal polysaccharide (PPSV23) vaccine. One dose is recommended after age 93. Talk to your health care provider about which screenings and vaccines you need and how often you  need them. This information is not intended to replace advice given to you by  your health care provider. Make sure you discuss any questions you have with your health care provider. Document Released: 05/12/2015 Document Revised: 01/03/2016 Document Reviewed: 02/14/2015 Elsevier Interactive Patient Education  2017 [place] Prevention in the Home Falls can cause injuries. They can happen to people of all ages. There are many things you can do to make your home safe and to help prevent falls. What can I do on the outside of my home? Regularly fix the edges of walkways and driveways and fix any cracks. Remove anything that might make you trip as you walk through a door, such as a raised step or threshold. Trim any bushes or trees on the path to your home. Use bright outdoor lighting. Clear any walking paths of anything that might make someone trip, such as rocks or tools. Regularly check to see if handrails are loose or broken. Make sure that both sides of any steps have handrails. Any raised decks and porches should have guardrails on the edges. Have any leaves, snow, or ice cleared regularly. Use sand or salt on walking paths during winter. Clean up any spills in your garage right away. This includes oil or grease spills. What can I do in the bathroom? Use night lights. Install grab bars by the toilet and in the tub and shower. Do not use towel bars as grab bars. Use non-skid mats or decals in the tub or shower. If you need to sit down in the shower, use a plastic, non-slip stool. Keep the floor dry. Clean up any water that spills on the floor as soon as it happens. Remove soap buildup in the tub or shower regularly. Attach bath mats securely with double-sided non-slip rug tape. Do not have throw rugs and other things on the floor that can make you trip. What can I do in the bedroom? Use night lights. Make sure that you have a light by your bed that is easy to reach. Do not use any sheets or blankets that are too big for your bed. They should not hang  down onto the floor. Have a firm chair that has side arms. You can use this for support while you get dressed. Do not have throw rugs and other things on the floor that can make you trip. What can I do in the kitchen? Clean up any spills right away. Avoid walking on wet floors. Keep items that you use a lot in easy-to-reach places. If you need to reach something above you, use a strong step stool that has a grab bar. Keep electrical cords out of the way. Do not use floor polish or wax that makes floors slippery. If you must use wax, use non-skid floor wax. Do not have throw rugs and other things on the floor that can make you trip. What can I do with my stairs? Do not leave any items on the stairs. Make sure that there are handrails on both sides of the stairs and use them. Fix handrails that are broken or loose. Make sure that handrails are as long as the stairways. Check any carpeting to make sure that it is firmly attached to the stairs. Fix any carpet that is loose or worn. Avoid having throw rugs at the top or bottom of the stairs. If you do have throw rugs, attach them to the floor with carpet tape. Make sure that you have a light switch  at the top of the stairs and the bottom of the stairs. If you do not have them, ask someone to add them for you. What else can I do to help prevent falls? Wear shoes that: Do not have high heels. Have rubber bottoms. Are comfortable and fit you well. Are closed at the toe. Do not wear sandals. If you use a stepladder: Make sure that it is fully opened. Do not climb a closed stepladder. Make sure that both sides of the stepladder are locked into place. Ask someone to hold it for you, if possible. Clearly mark and make sure that you can see: Any grab bars or handrails. First and last steps. Where the edge of each step is. Use tools that help you move around (mobility aids) if they are needed. These  include: Canes. Walkers. Scooters. Crutches. Turn on the lights when you go into a dark area. Replace any light bulbs as soon as they burn out. Set up your furniture so you have a clear path. Avoid moving your furniture around. If any of your floors are uneven, fix them. If there are any pets around you, be aware of where they are. Review your medicines with your doctor. Some medicines can make you feel dizzy. This can increase your chance of falling. Ask your doctor what other things that you can do to help prevent falls. This information is not intended to replace advice given to you by your health care provider. Make sure you discuss any questions you have with your health care provider. Document Released: 02/09/2009 Document Revised: 09/21/2015 Document Reviewed: 05/20/2014 Elsevier Interactive Patient Education  2017 [employer].

## 2021-10-25 DIAGNOSIS — H903 Sensorineural hearing loss, bilateral: Secondary | ICD-10-CM | POA: Diagnosis not present

## 2021-10-25 DIAGNOSIS — R42 Dizziness and giddiness: Secondary | ICD-10-CM | POA: Diagnosis not present

## 2021-10-25 DIAGNOSIS — H9313 Tinnitus, bilateral: Secondary | ICD-10-CM | POA: Diagnosis not present

## 2021-12-17 DIAGNOSIS — R42 Dizziness and giddiness: Secondary | ICD-10-CM | POA: Diagnosis not present

## 2021-12-17 DIAGNOSIS — G43909 Migraine, unspecified, not intractable, without status migrainosus: Secondary | ICD-10-CM | POA: Diagnosis not present

## 2022-01-29 ENCOUNTER — Telehealth: Payer: Self-pay | Admitting: Family Medicine

## 2022-01-29 ENCOUNTER — Ambulatory Visit: Payer: Medicare Other | Admitting: Family

## 2022-01-29 NOTE — Telephone Encounter (Signed)
Error; pt scheduled appointment instead

## 2022-02-01 ENCOUNTER — Ambulatory Visit: Payer: Medicare Other | Admitting: Family Medicine

## 2022-02-27 ENCOUNTER — Other Ambulatory Visit: Payer: Self-pay | Admitting: Family Medicine

## 2022-02-27 DIAGNOSIS — R0982 Postnasal drip: Secondary | ICD-10-CM

## 2022-02-27 DIAGNOSIS — R052 Subacute cough: Secondary | ICD-10-CM

## 2022-03-02 ENCOUNTER — Other Ambulatory Visit: Payer: Self-pay | Admitting: Family Medicine

## 2022-03-02 DIAGNOSIS — N529 Male erectile dysfunction, unspecified: Secondary | ICD-10-CM

## 2022-03-04 NOTE — Telephone Encounter (Signed)
Discussed in May - refilled.

## 2022-03-04 NOTE — Telephone Encounter (Signed)
Is this Rx ok to refill . Has not been refilled since 2022 but pt was seen recent

## 2022-04-30 ENCOUNTER — Telehealth: Payer: Self-pay | Admitting: Family Medicine

## 2022-04-30 NOTE — Telephone Encounter (Signed)
Received forms from [facility]. Placed in front bin with charge sheet.

## 2022-05-01 NOTE — Telephone Encounter (Signed)
Faxed to medical records this is a request for last 10 years of records

## 2022-05-06 ENCOUNTER — Telehealth: Payer: Self-pay | Admitting: Family Medicine

## 2022-05-06 NOTE — Telephone Encounter (Signed)
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 He wanted to give you a heads and maybe they're requesting his records. Please give a call.

## 2022-05-07 NOTE — Telephone Encounter (Signed)
This was just an FYI, pt wanted [place] to be aware this is why there would likely be a record request sent to [place]

## 2022-09-05 ENCOUNTER — Other Ambulatory Visit: Payer: Self-pay | Admitting: Family Medicine

## 2022-09-05 DIAGNOSIS — I10 Essential (primary) hypertension: Secondary | ICD-10-CM

## 2022-09-06 ENCOUNTER — Other Ambulatory Visit: Payer: Self-pay

## 2022-09-06 DIAGNOSIS — I10 Essential (primary) hypertension: Secondary | ICD-10-CM

## 2022-09-06 MED ORDER — LISINOPRIL 10 MG PO TABS: 5.0000 mg | ORAL_TABLET | Freq: Every day | ORAL | 3 refills | Status: DC

## 2022-09-18 ENCOUNTER — Ambulatory Visit (INDEPENDENT_AMBULATORY_CARE_PROVIDER_SITE_OTHER): Payer: Medicare Other | Admitting: Family Medicine

## 2022-09-18 VITALS — BP 124/70 | HR 67 | Temp 98.1°F | Ht 67.25 in | Wt 162.4 lb

## 2022-09-18 DIAGNOSIS — R944 Abnormal results of kidney function studies: Secondary | ICD-10-CM

## 2022-09-18 DIAGNOSIS — E7800 Pure hypercholesterolemia, unspecified: Secondary | ICD-10-CM | POA: Diagnosis not present

## 2022-09-18 DIAGNOSIS — G43809 Other migraine, not intractable, without status migrainosus: Secondary | ICD-10-CM

## 2022-09-18 DIAGNOSIS — R351 Nocturia: Secondary | ICD-10-CM

## 2022-09-18 DIAGNOSIS — I10 Essential (primary) hypertension: Secondary | ICD-10-CM | POA: Diagnosis not present

## 2022-09-18 DIAGNOSIS — N401 Enlarged prostate with lower urinary tract symptoms: Secondary | ICD-10-CM | POA: Diagnosis not present

## 2022-09-18 DIAGNOSIS — N529 Male erectile dysfunction, unspecified: Secondary | ICD-10-CM

## 2022-09-18 DIAGNOSIS — R42 Dizziness and giddiness: Secondary | ICD-10-CM

## 2022-09-18 LAB — COMPREHENSIVE METABOLIC PANEL
ALT: 18 U/L (ref 0–53)
AST: 30 U/L (ref 0–37)
Albumin: 4 g/dL (ref 3.5–5.2)
Alkaline Phosphatase: 52 U/L (ref 39–117)
BUN: 17 mg/dL (ref 6–23)
CO2: 26 mEq/L (ref 19–32)
Calcium: 9.5 mg/dL (ref 8.4–10.5)
Chloride: 103 mEq/L (ref 96–112)
Creatinine, Ser: 1.42 mg/dL (ref 0.40–1.50)
GFR: 49.54 mL/min — ABNORMAL LOW (ref >60.00)
Glucose, Bld: 88 mg/dL (ref 70–99)
Potassium: 4.1 mEq/L (ref 3.5–5.1)
Sodium: 135 mEq/L (ref 135–145)
Total Bilirubin: 0.8 mg/dL (ref 0.2–1.2)
Total Protein: 7.3 g/dL (ref 6.0–8.3)

## 2022-09-18 LAB — LIPID PANEL
Cholesterol: 197 mg/dL (ref 0–200)
HDL: 67.6 mg/dL (ref >39.00)
LDL Cholesterol: 115 mg/dL — ABNORMAL HIGH (ref 0–99)
NonHDL: 129.61
Total CHOL/HDL Ratio: 3
Triglycerides: 75 mg/dL (ref 0.0–149.0)
VLDL: 15 mg/dL (ref 0.0–40.0)

## 2022-09-18 MED ORDER — SILDENAFIL CITRATE 100 MG PO TABS
ORAL_TABLET | ORAL | 11 refills | Status: DC
Start: 2022-09-18 — End: 2023-03-26

## 2022-09-18 MED ORDER — MECLIZINE HCL 25 MG PO TABS
25.0000 mg | ORAL_TABLET | Freq: Three times a day (TID) | ORAL | 2 refills | Status: DC | PRN
Start: 2022-09-18 — End: 2023-09-19

## 2022-09-18 MED ORDER — FINASTERIDE 5 MG PO TABS
ORAL_TABLET | ORAL | 3 refills | Status: DC
Start: 2022-09-18 — End: 2022-11-15

## 2022-09-18 NOTE — Progress Notes (Signed)
Subjective:  Patient ID: Darrell Wells, male    DOB: 1951-04-01  Age: 71 y.o. MRN: [id number]  CC:  Chief Complaint  Patient presents with   Hypertension   Dizziness    Vertigo     HPI Darrell Wells presents for  Follow-up.  No acute concerns.   Hypertension: Treated with lisinopril 5 mg daily - 1/2 of 10mg , no new side effects  Home readings: up to 140/90 some days, other days 120/70. BP Readings from Last 3 Encounters:  09/18/22 124/70  09/14/21 128/64  07/25/21 128/80   Lab Results  Component Value Date   CREATININE 1.33 09/14/2021   Benign prostatic hypertrophy Proscar 5 mg daily, still working well with same nocturia 0-1 per night and maintaining adequate stream, no change in hesitancy or retention symptoms. No lightheadedness or other side effects with meds.  eGFR 53 on 09/14/21.  No nsaids.   Erectile dysfunction Treated with Viagra 50 to 100 mg, typically half pill (50 mg).  No new vision or hearing changes, headache, flushing, or chest pain with exertion. Still working well with half pill.  Exercising 5 days pr more per week.   History of vertigo Previously treated with as needed meclizine, has been evaluated by ENT.  Testing with audiology August of last year without evidence of peripheral or central vestibular function.  He did meet some criteria for vestibular migraine.  Option to meet with neurology to investigate possibility of migrainous etiology for episodes of recurrent vertigo, and per note recommended video of eye movement during next episode as nystagmus related to peripheral vestibular dysfunction generally more significant than that with migrainous vertigo. Has meclizine - last took about a month ago for flare. Helped flare.  Flares few times per month. On days when experiencing, slows dow a bit - watches movie or less active d/t dizziness. Does experience headache with symptoms. [facility] - thought to be [facility] related with migraines.  No HA  specialist[facility]uro with [facility].    Hyperlipidemia: Slight elevated LDL  at 107 on 09/14/21. No chol meds.  Lab Results  Component Value Date   CHOL 199 09/14/2021   HDL 73.80 09/14/2021   LDLCALC 107 (H) 09/14/2021   TRIG 93.0 09/14/2021   CHOLHDL 3 09/14/2021   Lab Results  Component Value Date   ALT 14 09/14/2021   AST 23 09/14/2021   ALKPHOS 58 09/14/2021   BILITOT 0.6 09/14/2021       History Patient Active Problem List   Diagnosis Date Noted   Essential hypertension, benign 05/03/2014   BPH (benign prostatic hyperplasia) 05/03/2014   Erectile dysfunction 05/03/2014   Past Medical History:  Diagnosis Date   Enlarged prostate    Hypertension    Vertigo    Past Surgical History:  Procedure Laterality Date   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 Authorizing Provider  finasteride (PROSCAR) 5 MG tablet TAKE 1 TABLET(5 MG) BY MOUTH DAILY 09/14/21  Yes Shade Flood, MD  fluticasone [facility]) 50 MCG/ACT nasal spray SHAKE LIQUID AND USE 2 SPRAYS IN [facility] NOSTRIL DAILY 02/27/22  Yes Shade Flood, MD  lisinopril (ZESTRIL) 10 MG tablet Take 0.5 tablets (5 mg total) by mouth daily. 09/06/22  Yes Shade Flood, MD  meclizine (ANTIVERT) 25 MG tablet Take 1 tablet (25 mg total) by mouth 3 (three)  times daily as needed for dizziness. 02/21/21  Yes Shade Flood, MD  sildenafil (VIAGRA) 100 MG tablet TAKE 1/2 TO 1 TABLET BY MOUTH AS NEEDED FOR ERICTILE DYSFUNCTION 03/04/22  Yes Shade Flood, MD   Social History   Socioeconomic History   Marital status: Single    Spouse name: Not on file   Number of children: Not on file   Years of education: Not on file   Highest education level: Not on file  Occupational History   Not on file  Tobacco Use   Smoking status: Never   Smokeless tobacco: Never  Vaping Use   Vaping Use:  Never used  Substance and Sexual Activity   Alcohol use: Yes    Comment: rare   Drug use: No   Sexual activity: Yes  Other Topics Concern   Not on file  Social History Narrative   Not on file   Social Determinants of Health   Financial Resource Strain: Low Risk  (10/18/2021)   Overall Financial Resource Strain (CARDIA)    Difficulty of Paying Living Expenses: Not hard at all  Food Insecurity: No Food Insecurity (10/18/2021)   Hunger Vital Sign    Worried About Running Out of Food in the Last Year: Never true    Ran Out of Food in the Last Year: Never true  Transportation Needs: No Transportation Needs (10/18/2021)   PRAPARE - Administrator, Civil Service (Medical): No    Lack of Transportation (Non-Medical): No  Physical Activity: Sufficiently Active (10/18/2021)   Exercise Vital Sign    Days of Exercise per Week: 5 days    Minutes of Exercise per Session: 90 min  Stress: No Stress Concern Present (10/18/2021)   [employer] of Occupational Health - Occupational Stress Questionnaire    Feeling of Stress : Not at all  Social Connections: Moderately Integrated (10/18/2021)   Social Connection and Isolation Panel [NHANES]    Frequency of Communication with Friends and Family: Three times a week    Frequency of Social Gatherings with Friends and Family: Three times a week    Attends Religious Services: More than 4 times per year    Active Member of Clubs or Organizations: Yes    Attends Banker Meetings: More than 4 times per year    Marital Status: Divorced  Intimate Partner Violence: Not At Risk (10/18/2021)   Humiliation, Afraid, Rape, and Kick questionnaire    Fear of Current or Ex-Partner: No    Emotionally Abused: No    Physically Abused: No    Sexually Abused: No    Review of Systems  Constitutional:  Negative for fatigue and unexpected weight change.  Eyes:  Negative for visual disturbance.  Respiratory:  Negative for cough, chest tightness  and shortness of breath.   Cardiovascular:  Negative for chest pain, palpitations and leg swelling.  Gastrointestinal:  Negative for abdominal pain and blood in stool.  Genitourinary:  Negative for difficulty urinating.  Neurological:  Negative for dizziness (only with vertigo spells.), light-headedness and headaches.   HM: Had shingles vaccine at pharmacy, recommended tdap if not utd at pharmacy, rsv vaccine as option at pharmacy. Covid booster with flu this fall.    Objective:   Vitals:   09/18/22 0917  BP: 124/70  Pulse: 67  Temp: 98.1 F (36.7 C)  TempSrc: Temporal  SpO2: 100%  Weight: 162 lb 6.4 oz (73.7 kg)  Height: 5' 7.25" (1.708 m)     Physical  Exam Vitals reviewed.  Constitutional:      Appearance: He is well-developed.  HENT:     Head: Normocephalic and atraumatic.  Neck:     Vascular: No carotid bruit or JVD.  Cardiovascular:     Rate and Rhythm: Normal rate and regular rhythm.     Heart sounds: Normal heart sounds. No murmur heard. Pulmonary:     Effort: Pulmonary effort is normal.     Breath sounds: Normal breath sounds. No rales.  Musculoskeletal:     Right lower leg: No edema.     Left lower leg: No edema.  Skin:    General: Skin is warm and dry.  Neurological:     Mental Status: He is alert and oriented to person, place, and time.     GCS: GCS eye subscore is 4. GCS verbal subscore is 5. GCS motor subscore is 6.     Cranial Nerves: No cranial nerve deficit, dysarthria or facial asymmetry.     Motor: Motor function is intact. No weakness or pronator drift.     Coordination: Coordination is intact. Finger-Nose-Finger Test and Heel to [facility] Test normal. Rapid alternating movements normal.     Gait: Gait is intact.  Psychiatric:        Mood and Affect: Mood normal.     Assessment & Plan:  Darrell Wells is a 72 y.o. male . Essential hypertension, benign - Plan: Comprehensive metabolic panel  -Stable in office, variable home readings, option of 10  mg lisinopril if persistent elevated readings and will advise me if he makes that change.  Handout given on management of hypertension.  Check updated labs with borderline decreased GFR but appear to be overall stable from prior readings.  Maintain hydration, avoidance of NSAIDs discussed.  Elevated LDL cholesterol level - Plan: Lipid panel  -Slight elevated LDL prior, no current meds.  Continues to exercise, no new meds for now but check updated labs with adjustment of plan if needed.  Benign prostatic hyperplasia with nocturia - Plan: finasteride (PROSCAR) 5 MG tablet  -Stable with Proscar, continue same.  Erectile dysfunction, unspecified erectile dysfunction type - Plan: sildenafil (VIAGRA) 100 MG tablet  - viagra Rx given - use lowest effective dose. Side effects discussed (including but not limited to headache/flushing, blue discoloration of vision, possible vascular steal and risk of cardiac effects if underlying unknown coronary artery disease, and permanent sensorineural hearing loss). Understanding expressed.  Vertigo - Plan: meclizine (ANTIVERT) 25 MG tablet Vestibular migraine - Plan: Ambulatory referral to Neurology  -Few episodes per month, some flares mild better than others but now with possible suggestion of vestibular migraine recommended evaluation by neurology to decide if preventative for daily treatments may be indicated.  If needed for now.  Referral ordered.  Decreased GFR  -As above, check labs.  Avoid nephrotoxins.  Meds ordered this encounter  Medications   finasteride (PROSCAR) 5 MG tablet    Sig: TAKE 1 TABLET(5 MG) BY MOUTH DAILY    Dispense:  90 tablet    Refill:  3   sildenafil (VIAGRA) 100 MG tablet    Sig: TAKE 1/2 TO 1 TABLET BY MOUTH AS NEEDED FOR ERICTILE DYSFUNCTION    Dispense:  5 tablet    Refill:  11   meclizine (ANTIVERT) 25 MG tablet    Sig: Take 1 tablet (25 mg total) by mouth 3 (three) times daily as needed for dizziness.    Dispense:  30  tablet    Refill:  2  Patient Instructions   If persistent higher blood pressure readings, increase to full pill (10mg  of lisinopril). Let me know if you make that change.  No other medication changes at this time.  I will let you know if there are any concerns on your labs.  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  Okay to use meclizine sparingly for now.  Tetanus booster can be given at your pharmacy, but would recommend checking into coverage with your insurance.  RSV vaccine is also an option, see information below.  COVID booster with flu vaccine this fall.  Recheck with me in 1 year but let me know if there are questions in the meantime and take care!   There is a recommendation for RSV vaccine for patients over age 15.   Typically would recommend the RSV vaccine as most important for patients over age 68 that have comorbidities that put them at increased risk for severe disease (heart disease such as congestive heart failure, coronary artery disease, lung disease such as asthma or COPD, kidney disease, liver disease, diabetes, chronic or progressive neurologic or muscular conditions, immunosuppressed, or being frail or of advanced age).  For others that do not have these risk factors, there still is some benefit from vaccination since age is one of the main risk factors for developing severe disease however baseline risk of developing severe disease and requiring hospitalization is likely to be lower compared to those that have comorbidities in addition to age.   CDC does have some information as well: ToyProtection.fi    Managing Your Hypertension Hypertension, also called high blood pressure, is when the force of the blood pressing against the walls of the arteries is too strong. Arteries are blood vessels that carry blood from your heart throughout your body.  Hypertension forces the heart to work harder to pump blood and may cause the arteries to become narrow or stiff. Understanding blood pressure readings A blood pressure reading includes a higher number over a lower number: The first, or top, number is called the systolic pressure. It is a measure of the pressure in your arteries as your heart beats. The second, or bottom number, is called the diastolic pressure. It is a measure of the pressure in your arteries as the heart relaxes. For most people, a normal blood pressure is below 120/80. Your personal target blood pressure may vary depending on your medical conditions, your age, and other factors. Blood pressure is classified into four stages. Based on your blood pressure reading, your health care provider may use the following stages to determine what type of treatment you need, if any. Systolic pressure and diastolic pressure are measured in a unit called millimeters of mercury (mmHg). Normal Systolic pressure: below 120. Diastolic pressure: below 80. Elevated Systolic pressure: 120-129. Diastolic pressure: below 80. Hypertension stage 1 Systolic pressure: 130-139. Diastolic pressure: 80-89. Hypertension stage 2 Systolic pressure: 140 or above. Diastolic pressure: 90 or above. How can this condition affect me? Managing your hypertension is very important. Over time, hypertension can damage the arteries and decrease blood flow to parts of the body, including the brain, heart, and kidneys. Having untreated or uncontrolled hypertension can lead to: A heart attack. A stroke. A weakened blood vessel (aneurysm). Heart failure. Kidney damage. Eye damage. Memory and concentration problems. Vascular dementia. What actions can I take to manage this condition? Hypertension can be managed by making lifestyle changes and possibly by taking medicines.  Your health care provider will help you make a plan to bring your blood pressure within a normal  range. You may be referred for counseling on a healthy diet and physical activity. Nutrition  Eat a diet that is high in fiber and potassium, and low in salt (sodium), added sugar, and fat. An example eating plan is called the DASH diet. DASH stands for Dietary Approaches to Stop Hypertension. To eat this way: Eat plenty of fresh fruits and vegetables. Try to fill one-half of your plate at each meal with fruits and vegetables. Eat whole grains, such as whole-wheat pasta, brown rice, or whole-grain bread. Fill about one-fourth of your plate with whole grains. Eat low-fat dairy products. Avoid fatty cuts of meat, processed or cured meats, and poultry with skin. Fill about one-fourth of your plate with lean proteins such as fish, chicken without skin, beans, eggs, and tofu. Avoid pre-made and processed foods. These tend to be higher in sodium, added sugar, and fat. Reduce your daily sodium intake. Many people with hypertension should eat less than 1,500 mg of sodium a day. Lifestyle  Work with your health care provider to maintain a healthy body weight or to lose weight. Ask what an ideal weight is for you. Get at least 30 minutes of exercise that causes your heart to beat faster (aerobic exercise) most days of the week. Activities may include walking, swimming, or biking. Include exercise to strengthen your muscles (resistance exercise), such as weight lifting, as part of your weekly exercise routine. Try to do these types of exercises for 30 minutes at least 3 days a week. Do not use any products that contain nicotine or tobacco. These products include cigarettes, chewing tobacco, and vaping devices, such as e-cigarettes. If you need help quitting, ask your health care provider. Control any long-term (chronic) conditions you have, such as high cholesterol or diabetes. Identify your sources of stress and find ways to manage stress. This may include meditation, deep breathing, or making time for fun  activities. Alcohol use Do not drink alcohol if: Your health care provider tells you not to drink. You are pregnant, may be pregnant, or are planning to become pregnant. If you drink alcohol: Limit how much you have to: 0-1 drink a day for women. 0-2 drinks a day for men. Know how much alcohol is in your drink. In the [place] one drink equals one 12 oz bottle of beer (355 mL), one 5 oz glass of wine (148 mL), or one 1 oz glass of hard liquor (44 mL). Medicines Your health care provider may prescribe medicine if lifestyle changes are not enough to get your blood pressure under control and if: Your systolic blood pressure is 130 or higher. Your diastolic blood pressure is 80 or higher. Take medicines only as told by your health care provider. Follow the directions carefully. Blood pressure medicines must be taken as told by your health care provider. The medicine does not work as well when you skip doses. Skipping doses also puts you at risk for problems. Monitoring Before you monitor your blood pressure: Do not smoke, drink caffeinated beverages, or exercise within 30 minutes before taking a measurement. Use the bathroom and empty your bladder (urinate). Sit quietly for at least 5 minutes before taking measurements. Monitor your blood pressure at home as told by your health care provider. To do this: Sit with your back straight and supported. Place your feet flat on the floor. Do not cross your legs. Support your arm  on a flat surface, such as a table. Make sure your upper arm is at heart level. Each time you measure, take two or three readings one minute apart and record the results. You may also need to have your blood pressure checked regularly by your health care provider. General information Talk with your health care provider about your diet, exercise habits, and other lifestyle factors that may be contributing to hypertension. Review all the medicines you take with your health care  provider because there may be side effects or interactions. Keep all follow-up visits. Your health care provider can help you create and adjust your plan for managing your high blood pressure. Where to find more information National Heart, Lung, and Blood Institute: PopSteam.is American Heart Association: www.heart.org Contact a health care provider if: You think you are having a reaction to medicines you have taken. You have repeated (recurrent) headaches. You feel dizzy. You have swelling in your ankles. You have trouble with your vision. Get help right away if: You develop a severe headache or confusion. You have unusual weakness or numbness, or you feel faint. You have severe pain in your chest or abdomen. You vomit repeatedly. You have trouble breathing. These symptoms may be an emergency. Get help right away. Call 911. Do not wait to see if the symptoms will go away. Do not drive yourself to the hospital. Summary Hypertension is when the force of blood pumping through your arteries is too strong. If this condition is not controlled, it may put you at risk for serious complications. Your personal target blood pressure may vary depending on your medical conditions, your age, and other factors. For most people, a normal blood pressure is less than 120/80. Hypertension is managed by lifestyle changes, medicines, or both. Lifestyle changes to help manage hypertension include losing weight, eating a healthy, low-sodium diet, exercising more, stopping smoking, and limiting alcohol. This information is not intended to replace advice given to you by your health care provider. Make sure you discuss any questions you have with your health care provider. Document Revised: 12/28/2020 Document Reviewed: 12/28/2020 Elsevier Patient Education  2023 [employer] Inc.       Signed,   Meredith Staggers, MD [facility], [facility] 09/18/22 10:09  AM

## 2022-09-18 NOTE — Patient Instructions (Addendum)
If persistent higher blood pressure readings, increase to full pill (10mg  of lisinopril). Let me know if you make that change.  No other medication changes at this time.  I will let you know if there are any concerns on your labs.  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  Okay to use meclizine sparingly for now.  Tetanus booster can be given at your pharmacy, but would recommend checking into coverage with your insurance.  RSV vaccine is also an option, see information below.  COVID booster with flu vaccine this fall.  Recheck with me in 1 year but let me know if there are questions in the meantime and take care!   There is a recommendation for RSV vaccine for patients over age 74.   Typically would recommend the RSV vaccine as most important for patients over age 45 that have comorbidities that put them at increased risk for severe disease (heart disease such as congestive heart failure, coronary artery disease, lung disease such as asthma or COPD, kidney disease, liver disease, diabetes, chronic or progressive neurologic or muscular conditions, immunosuppressed, or being frail or of advanced age).  For others that do not have these risk factors, there still is some benefit from vaccination since age is one of the main risk factors for developing severe disease however baseline risk of developing severe disease and requiring hospitalization is likely to be lower compared to those that have comorbidities in addition to age.   CDC does have some information as well: ToyProtection.fi    Managing Your Hypertension Hypertension, also called high blood pressure, is when the force of the blood pressing against the walls of the arteries is too strong. Arteries are blood vessels that carry blood from your heart throughout your body. Hypertension forces the heart to work harder to pump  blood and may cause the arteries to become narrow or stiff. Understanding blood pressure readings A blood pressure reading includes a higher number over a lower number: The first, or top, number is called the systolic pressure. It is a measure of the pressure in your arteries as your heart beats. The second, or bottom number, is called the diastolic pressure. It is a measure of the pressure in your arteries as the heart relaxes. For most people, a normal blood pressure is below 120/80. Your personal target blood pressure may vary depending on your medical conditions, your age, and other factors. Blood pressure is classified into four stages. Based on your blood pressure reading, your health care provider may use the following stages to determine what type of treatment you need, if any. Systolic pressure and diastolic pressure are measured in a unit called millimeters of mercury (mmHg). Normal Systolic pressure: below 120. Diastolic pressure: below 80. Elevated Systolic pressure: 120-129. Diastolic pressure: below 80. Hypertension stage 1 Systolic pressure: 130-139. Diastolic pressure: 80-89. Hypertension stage 2 Systolic pressure: 140 or above. Diastolic pressure: 90 or above. How can this condition affect me? Managing your hypertension is very important. Over time, hypertension can damage the arteries and decrease blood flow to parts of the body, including the brain, heart, and kidneys. Having untreated or uncontrolled hypertension can lead to: A heart attack. A stroke. A weakened blood vessel (aneurysm). Heart failure. Kidney damage. Eye damage. Memory and concentration problems. Vascular dementia. What actions can I take to manage this condition? Hypertension can be managed by making lifestyle changes and possibly by taking medicines. Your health care provider  will help you make a plan to bring your blood pressure within a normal range. You may be referred for counseling on a healthy  diet and physical activity. Nutrition  Eat a diet that is high in fiber and potassium, and low in salt (sodium), added sugar, and fat. An example eating plan is called the DASH diet. DASH stands for Dietary Approaches to Stop Hypertension. To eat this way: Eat plenty of fresh fruits and vegetables. Try to fill one-half of your plate at each meal with fruits and vegetables. Eat whole grains, such as whole-wheat pasta, brown rice, or whole-grain bread. Fill about one-fourth of your plate with whole grains. Eat low-fat dairy products. Avoid fatty cuts of meat, processed or cured meats, and poultry with skin. Fill about one-fourth of your plate with lean proteins such as fish, chicken without skin, beans, eggs, and tofu. Avoid pre-made and processed foods. These tend to be higher in sodium, added sugar, and fat. Reduce your daily sodium intake. Many people with hypertension should eat less than 1,500 mg of sodium a day. Lifestyle  Work with your health care provider to maintain a healthy body weight or to lose weight. Ask what an ideal weight is for you. Get at least 30 minutes of exercise that causes your heart to beat faster (aerobic exercise) most days of the week. Activities may include walking, swimming, or biking. Include exercise to strengthen your muscles (resistance exercise), such as weight lifting, as part of your weekly exercise routine. Try to do these types of exercises for 30 minutes at least 3 days a week. Do not use any products that contain nicotine or tobacco. These products include cigarettes, chewing tobacco, and vaping devices, such as e-cigarettes. If you need help quitting, ask your health care provider. Control any long-term (chronic) conditions you have, such as high cholesterol or diabetes. Identify your sources of stress and find ways to manage stress. This may include meditation, deep breathing, or making time for fun activities. Alcohol use Do not drink alcohol if: Your  health care provider tells you not to drink. You are pregnant, may be pregnant, or are planning to become pregnant. If you drink alcohol: Limit how much you have to: 0-1 drink a day for women. 0-2 drinks a day for men. Know how much alcohol is in your drink. In the [place] one drink equals one 12 oz bottle of beer (355 mL), one 5 oz glass of wine (148 mL), or one 1 oz glass of hard liquor (44 mL). Medicines Your health care provider may prescribe medicine if lifestyle changes are not enough to get your blood pressure under control and if: Your systolic blood pressure is 130 or higher. Your diastolic blood pressure is 80 or higher. Take medicines only as told by your health care provider. Follow the directions carefully. Blood pressure medicines must be taken as told by your health care provider. The medicine does not work as well when you skip doses. Skipping doses also puts you at risk for problems. Monitoring Before you monitor your blood pressure: Do not smoke, drink caffeinated beverages, or exercise within 30 minutes before taking a measurement. Use the bathroom and empty your bladder (urinate). Sit quietly for at least 5 minutes before taking measurements. Monitor your blood pressure at home as told by your health care provider. To do this: Sit with your back straight and supported. Place your feet flat on the floor. Do not cross your legs. Support your arm on a flat surface,  such as a table. Make sure your upper arm is at heart level. Each time you measure, take two or three readings one minute apart and record the results. You may also need to have your blood pressure checked regularly by your health care provider. General information Talk with your health care provider about your diet, exercise habits, and other lifestyle factors that may be contributing to hypertension. Review all the medicines you take with your health care provider because there may be side effects or  interactions. Keep all follow-up visits. Your health care provider can help you create and adjust your plan for managing your high blood pressure. Where to find more information National Heart, Lung, and Blood Institute: PopSteam.is American Heart Association: www.heart.org Contact a health care provider if: You think you are having a reaction to medicines you have taken. You have repeated (recurrent) headaches. You feel dizzy. You have swelling in your ankles. You have trouble with your vision. Get help right away if: You develop a severe headache or confusion. You have unusual weakness or numbness, or you feel faint. You have severe pain in your chest or abdomen. You vomit repeatedly. You have trouble breathing. These symptoms may be an emergency. Get help right away. Call 911. Do not wait to see if the symptoms will go away. Do not drive yourself to the hospital. Summary Hypertension is when the force of blood pumping through your arteries is too strong. If this condition is not controlled, it may put you at risk for serious complications. Your personal target blood pressure may vary depending on your medical conditions, your age, and other factors. For most people, a normal blood pressure is less than 120/80. Hypertension is managed by lifestyle changes, medicines, or both. Lifestyle changes to help manage hypertension include losing weight, eating a healthy, low-sodium diet, exercising more, stopping smoking, and limiting alcohol. This information is not intended to replace advice given to you by your health care provider. Make sure you discuss any questions you have with your health care provider. Document Revised: 12/28/2020 Document Reviewed: 12/28/2020 Elsevier Patient Education  2023 [employer].

## 2022-10-24 ENCOUNTER — Ambulatory Visit (INDEPENDENT_AMBULATORY_CARE_PROVIDER_SITE_OTHER): Payer: Medicare Other | Admitting: *Deleted

## 2022-10-24 DIAGNOSIS — Z0000 Encounter for general adult medical examination without abnormal findings: Secondary | ICD-10-CM | POA: Diagnosis not present

## 2022-10-24 NOTE — Progress Notes (Signed)
Subjective:   Darrell Wells is a 72 y.o. male who presents for Medicare Annual/Subsequent preventive examination.  Visit Complete: Virtual  I connected with  Darrell Wells on 10/24/22 by a audio enabled telemedicine application and verified that I am speaking with the correct person using two identifiers.  Patient Location: Home  Provider Location: Home Office  I discussed the limitations of evaluation and management by telemedicine. The patient expressed understanding and agreed to proceed.  Patient Medicare AWV questionnaire was completed by the patient on 10-20-2022; I have confirmed that all information answered by patient is correct and no changes since this date.  Review of Systems     Cardiac Risk Factors include: advanced age (>40men, >61 women);male gender;hypertension     Objective:    Today's Vitals   There is no height or weight on file to calculate BMI.     10/24/2022   11:04 AM 10/18/2021    8:24 AM 09/14/2021    9:34 AM 08/23/2020    8:34 AM 08/23/2019   11:23 AM 08/18/2018    1:07 PM 07/10/2018    2:02 PM  Advanced Directives  Does Patient Have a Medical Advance Directive? Yes Yes Yes Yes Yes No;Yes No  Type of Estate agent of [employer] Power of [name];Living will  Living will;Healthcare Power of [employer] Power of [name];Living will Living will;Healthcare Power of Attorney   Does patient want to make changes to medical advance directive?   No - Patient declined No - Patient declined No - Patient declined No - Patient declined   Copy of Healthcare Power of Attorney in Chart? No - copy requested No - copy requested   No - copy requested No - copy requested   Would patient like information on creating a medical advance directive?      No - Patient declined Yes (ED - Information included in AVS)    Current Medications (verified) Outpatient Encounter Medications as of 10/24/2022  Medication Sig   finasteride (PROSCAR) 5  MG tablet TAKE 1 TABLET(5 MG) BY MOUTH DAILY   fluticasone (FLONASE) 50 MCG/ACT nasal spray SHAKE LIQUID AND USE 2 SPRAYS IN EACH NOSTRIL DAILY   lisinopril (ZESTRIL) 10 MG tablet Take 0.5 tablets (5 mg total) by mouth daily.   meclizine (ANTIVERT) 25 MG tablet Take 1 tablet (25 mg total) by mouth 3 (three) times daily as needed for dizziness.   sildenafil (VIAGRA) 100 MG tablet TAKE 1/2 TO 1 TABLET BY MOUTH AS NEEDED FOR ERICTILE DYSFUNCTION   No facility-administered encounter medications on file as of 10/24/2022.    Allergies (verified) Patient has no known allergies.   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   REPAIR KNEE LIGAMENT Left    Family History  Problem Relation Age of Onset   Stroke Mother    Heart disease Brother    Colon cancer Neg Hx    Esophageal cancer Neg Hx    Rectal cancer Neg Hx    Stomach cancer Neg Hx    Social History   Socioeconomic History   Marital status: Single    Spouse name: Not on file   Number of children: Not on file   Years of education: Not on file   Highest education level: Not on file  Occupational History  Not on file  Tobacco Use   Smoking status: Never   Smokeless tobacco: Never  Vaping Use   Vaping Use: Never used  Substance and Sexual Activity   Alcohol use: Yes    Comment: rare   Drug use: No   Sexual activity: Yes  Other Topics Concern   Not on file  Social History Narrative   Not on file   Social Determinants of Health   Financial Resource Strain: Low Risk  (10/24/2022)   Overall Financial Resource Strain (CARDIA)    Difficulty of Paying Living Expenses: Not hard at all  Food Insecurity: No Food Insecurity (10/24/2022)   Hunger Vital Sign    Worried About Running Out of Food in the Last Year: Never true    Ran Out of Food in the Last Year: Never  true  Transportation Needs: No Transportation Needs (10/24/2022)   PRAPARE - Administrator, Civil Service (Medical): No    Lack of Transportation (Non-Medical): No  Physical Activity: Sufficiently Active (10/24/2022)   Exercise Vital Sign    Days of Exercise per Week: 6 days    Minutes of Exercise per Session: 80 min  Stress: No Stress Concern Present (10/24/2022)   [employer] of Occupational Health - Occupational Stress Questionnaire    Feeling of Stress : Not at all  Social Connections: Unknown (10/24/2022)   Social Connection and Isolation Panel [NHANES]    Frequency of Communication with Friends and Family: More than three times a week    Frequency of Social Gatherings with Friends and Family: Once a week    Attends Religious Services: More than 4 times per year    Active Member of [employer] or Organizations: Yes    Attends Banker Meetings: 1 to 4 times per year    Marital Status: Not on file    Tobacco Counseling Counseling given: Not Answered   Clinical Intake:  Pre-visit preparation completed: Yes  Pain : No/denies pain     Diabetes: No  How often do you need to have someone help you when you read instructions, pamphlets, or other written materials from your doctor or pharmacy?: 1 - Never  Interpreter Needed?: No  Information entered by :: Remi Haggard LPN   Activities of Daily Living    10/24/2022   11:03 AM 10/20/2022   11:51 AM  In your present state of health, do you have any difficulty performing the following activities:  Hearing? 0 0  Vision? 0 0  Difficulty concentrating or making decisions? 0 0  Walking or climbing stairs? 0 0  Dressing or bathing? 0 0  Doing errands, shopping? 0 0  Preparing Food and eating ? N N  Using the Toilet? N N  In the past six months, have you accidently leaked urine? N N  Do you have problems with loss of bowel control? N N  Managing your Medications? N N  Managing your Finances? N N   Housekeeping or managing your Housekeeping? N N    Patient Care Team: Shade Flood, MD as PCP - General (Family Medicine)  Indicate any recent Medical Services you may have received from other than [facility] providers in the past year (date may be approximate).     Assessment:   This is a routine wellness examination for [place].  Hearing/Vision screen Hearing Screening - Comments:: No trouble hearing  Vision Screening - Comments:: Not up to date   Dietary issues and exercise activities discussed:  Goals Addressed             This Visit's Progress    Patient Stated       More volunteer with [employer]       Depression Screen    10/24/2022   11:08 AM 09/18/2022    9:11 AM 10/18/2021    8:25 AM 10/18/2021    8:23 AM 07/25/2021    1:59 PM 08/23/2020    8:20 AM 02/23/2020    7:55 AM  PHQ 2/9 Scores  PHQ - 2 Score 0 0 0 0 0 0 0  PHQ- 9 Score 0 0         Fall Risk    10/24/2022   11:03 AM 10/20/2022   11:51 AM 09/18/2022    9:11 AM 10/18/2021    8:25 AM 07/25/2021    1:58 PM  Fall Risk   Falls in the past year? 0 0 0 0 0  Number falls in past yr:  0 0 0 0  Injury with Fall?  0 0 0 0  Risk for fall due to :   No Fall Risks  No Fall Risks  Follow up   Falls evaluation completed Falls evaluation completed;Education provided Falls evaluation completed    MEDICARE RISK AT HOME:   TIMED UP AND GO:  Was the test performed?  No    Cognitive Function:        10/24/2022   11:05 AM 09/18/2022    9:10 AM 09/14/2021    9:31 AM 08/23/2020    8:35 AM 08/23/2019   11:20 AM  6CIT Screen  What Year? 0 points 0 points 0 points 0 points 0 points  What month? 0 points 0 points 0 points 0 points 0 points  What time? 0 points 0 points 0 points 0 points 0 points  Count back from 20 0 points 0 points 0 points 0 points 0 points  Months in reverse 0 points 0 points 0 points 0 points 0 points  Repeat phrase 0 points 0 points 0 points 0 points 0 points  Total Score 0 points 0  points 0 points 0 points 0 points    Immunizations Immunization History  Administered Date(s) Administered   Fluad Quad(high Dose 65+) 01/11/2019, 02/23/2020, 02/28/2022   Influenza, High Dose Seasonal PF 07/10/2018   Influenza,inj,Quad PF,6+ Mos 05/03/2014, 05/09/2015, 04/18/2016, 05/26/2017   Moderna Sars-Covid-2 Vaccination 11/09/2020   PFIZER(Purple Top)SARS-COV-2 Vaccination 06/28/2019, 07/21/2019, 02/10/2020   Pfizer Covid-19 Vaccine Bivalent Booster 65yrs & up 03/16/2021   Pneumococcal Conjugate-13 05/23/2016   Pneumococcal Polysaccharide-23 05/26/2017   Tdap 01/21/2011   Zoster Recombinat (Shingrix) 08/23/2020    TDAP status: Due, Education has been provided regarding the importance of this vaccine. Advised may receive this vaccine at local pharmacy or Health Dept. Aware to provide a copy of the vaccination record if obtained from local pharmacy or Health Dept. Verbalized acceptance and understanding.  Flu Vaccine status: Up to date  Pneumococcal vaccine status: Up to date  Covid-19 vaccine status: Information provided on how to obtain vaccines.   Qualifies for Shingles Vaccine? Yes   Zostavax completed No   Shingrix Completed?: No.    Education has been provided regarding the importance of this vaccine. Patient has been advised to call insurance company to determine out of pocket expense if they have not yet received this vaccine. Advised may also receive vaccine at local pharmacy or Health Dept. Verbalized acceptance and understanding.  Screening Tests Health Maintenance  Topic Date  Due   Zoster Vaccines- Shingrix (2 of 2) 10/18/2020   DTaP/Tdap/Td (2 - Td or Tdap) 01/20/2021   COVID-19 Vaccine (6 - 2023-24 season) 11/09/2022 (Originally 12/28/2021)   INFLUENZA VACCINE  11/28/2022   Medicare Annual Wellness (AWV)  10/24/2023   Pneumonia Vaccine 54+ Years old  Completed   Hepatitis C Screening  Completed   HPV VACCINES  Aged Out    Health Maintenance  Health  Maintenance Due  Topic Date Due   Zoster Vaccines- Shingrix (2 of 2) 10/18/2020   DTaP/Tdap/Td (2 - Td or Tdap) 01/20/2021    Colorectal cancer screening: Type of screening: Colonoscopy. Completed 2020. Repeat every 10 years  Lung Cancer Screening: (Low Dose CT Chest recommended if Age 65-80 years, 20 pack-year currently smoking OR have quit w/in 15years.) does not qualify.   Lung Cancer Screening Referral:   Additional Screening:  Hepatitis C Screening: does not qualify; Completed 2017  Vision Screening: Recommended annual ophthalmology exams for early detection of glaucoma and other disorders of the eye. Is the patient up to date with their annual eye exam?  No  Who is the provider or what is the name of the office in which the patient attends annual eye exams?  If pt is not established with a provider, would they like to be referred to a provider to establish care? No .   Dental Screening: Recommended annual dental exams for proper oral hygiene    Community Resource Referral / Chronic Care Management: CRR required this visit?  No   CCM required this visit?  No     Plan:     I have personally reviewed and noted the following in the patient's chart:   Medical and social history Use of alcohol, tobacco or illicit drugs  Current medications and supplements including opioid prescriptions. Patient is not currently taking opioid prescriptions. Functional ability and status Nutritional status Physical activity Advanced directives List of other physicians Hospitalizations, surgeries, and ER visits in previous 12 months Vitals Screenings to include cognitive, depression, and falls Referrals and appointments  In addition, I have reviewed and discussed with patient certain preventive protocols, quality metrics, and best practice recommendations. A written personalized care plan for preventive services as well as general preventive health recommendations were provided to  patient.     Remi Haggard, LPN   08/05/8117   After Visit Summary: (MyChart) Due to this being a telephonic visit, the after visit summary with patients personalized plan was offered to patient via MyChart   Nurse Notes:

## 2022-11-15 ENCOUNTER — Other Ambulatory Visit: Payer: Self-pay | Admitting: Family Medicine

## 2022-11-15 DIAGNOSIS — N401 Enlarged prostate with lower urinary tract symptoms: Secondary | ICD-10-CM

## 2023-02-11 DIAGNOSIS — R972 Elevated prostate specific antigen [PSA]: Secondary | ICD-10-CM | POA: Insufficient documentation

## 2023-02-11 DIAGNOSIS — E785 Hyperlipidemia, unspecified: Secondary | ICD-10-CM | POA: Insufficient documentation

## 2023-02-11 DIAGNOSIS — G43809 Other migraine, not intractable, without status migrainosus: Secondary | ICD-10-CM | POA: Insufficient documentation

## 2023-02-11 DIAGNOSIS — H524 Presbyopia: Secondary | ICD-10-CM | POA: Insufficient documentation

## 2023-02-11 DIAGNOSIS — H5200 Hypermetropia, unspecified eye: Secondary | ICD-10-CM | POA: Insufficient documentation

## 2023-02-11 DIAGNOSIS — Z136 Encounter for screening for cardiovascular disorders: Secondary | ICD-10-CM | POA: Insufficient documentation

## 2023-02-11 DIAGNOSIS — Z0289 Encounter for other administrative examinations: Secondary | ICD-10-CM | POA: Insufficient documentation

## 2023-02-11 DIAGNOSIS — H18419 Arcus senilis, unspecified eye: Secondary | ICD-10-CM | POA: Insufficient documentation

## 2023-02-11 DIAGNOSIS — H52229 Regular astigmatism, unspecified eye: Secondary | ICD-10-CM | POA: Insufficient documentation

## 2023-02-12 ENCOUNTER — Ambulatory Visit (INDEPENDENT_AMBULATORY_CARE_PROVIDER_SITE_OTHER): Payer: Medicare Other | Admitting: Family Medicine

## 2023-02-12 ENCOUNTER — Encounter: Payer: Self-pay | Admitting: Family Medicine

## 2023-02-12 ENCOUNTER — Other Ambulatory Visit (HOSPITAL_COMMUNITY)
Admission: RE | Admit: 2023-02-12 | Discharge: 2023-02-12 | Disposition: A | Discharge status: Home / Self Care | Payer: Medicare Other | Source: Ambulatory Visit | Attending: Family Medicine | Admitting: Family Medicine

## 2023-02-12 VITALS — BP 120/82 | HR 82 | Temp 98.0°F | Ht 67.25 in | Wt 165.4 lb

## 2023-02-12 DIAGNOSIS — N401 Enlarged prostate with lower urinary tract symptoms: Secondary | ICD-10-CM

## 2023-02-12 DIAGNOSIS — Z113 Encounter for screening for infections with a predominantly sexual mode of transmission: Secondary | ICD-10-CM

## 2023-02-12 DIAGNOSIS — Z114 Encounter for screening for human immunodeficiency virus [HIV]: Secondary | ICD-10-CM

## 2023-02-12 DIAGNOSIS — N529 Male erectile dysfunction, unspecified: Secondary | ICD-10-CM | POA: Diagnosis not present

## 2023-02-12 DIAGNOSIS — Z23 Encounter for immunization: Secondary | ICD-10-CM | POA: Diagnosis not present

## 2023-02-12 DIAGNOSIS — E7800 Pure hypercholesterolemia, unspecified: Secondary | ICD-10-CM

## 2023-02-12 DIAGNOSIS — I10 Essential (primary) hypertension: Secondary | ICD-10-CM | POA: Diagnosis not present

## 2023-02-12 DIAGNOSIS — G43809 Other migraine, not intractable, without status migrainosus: Secondary | ICD-10-CM

## 2023-02-12 DIAGNOSIS — R351 Nocturia: Secondary | ICD-10-CM

## 2023-02-12 NOTE — Progress Notes (Signed)
Subjective:  Patient ID: Darrell Wells, male    DOB: [birth year]  Age: 72 y.o. MRN: [id number]  CC:  Chief Complaint  Patient presents with   Medical Management of Chronic Issues    Want blood work to be test for STD, flu shot given and had no other issue     HPI [name] presents for   Follow up. Doing well. Bought new home. [place]    Hypertension: Lisinopril 5mg  daily Home readings:  120-134/under 90.  BP Readings from Last 3 Encounters:  02/12/23 120/82  09/18/22 124/70  09/14/21 128/64   Lab Results  Component Value Date   CREATININE 1.42 09/18/2022    BPH Treated with finasteride 5 mg daily.  Stable when discussed in May, 0-2episodes of nocturia, but more water intake.   Erectile dysfunction Treated with Viagra 50 to 100 mg as needed, typically has taken half pill without new vision changes, hearing changes, headache, or chest pain/dyspnea with exertion.  Current dose has been effective.  Vertigo Discussed in May, few episodes per that time.  Referred to neurology to evaluate for possible vestibular migraine.  Continued on meclizine at that time. No recent changes. 2 days in past month - helps with meclizine. Less frequent. Not seen by neuro d/t some concerns about [place]or ([facility]).   STD screening New relationship past 4 months, partner and he are having routine screening. No hx of STI or new symptoms.   ABN requested for HIV test, I selected print but not seen on printer.  I did discuss options with patient, and he is okay with option to bill Medicare but will cover cost if not covered.  Plans on covid vaccine at pharmacy.   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   Presbyopia 02/11/2023   Regular astigmatism 02/11/2023   Screening for hypertension 02/11/2023   Senile corneal changes  02/11/2023   Encounter for other administrative examinations 02/11/2023   Vestibular migraine 02/11/2023   Vertigo 09/11/2021   Essential hypertension, benign 05/03/2014   BPH (benign prostatic hyperplasia) 05/03/2014   Erectile dysfunction 05/03/2014   Past Medical History:  Diagnosis Date   Enlarged prostate    Hypertension    Vertigo    Past Surgical History:  Procedure Laterality Date   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 Authorizing Provider  finasteride (PROSCAR) 5 MG tablet TAKE 1 TABLET(5 MG) BY MOUTH DAILY 11/15/22  Yes Shade Flood, MD  fluticasone [facility]) 50 MCG/ACT nasal spray SHAKE LIQUID AND USE 2 SPRAYS IN [facility] NOSTRIL DAILY 02/27/22  Yes Shade Flood, MD  lisinopril (ZESTRIL) 10 MG tablet Take 0.5 tablets (5 mg total) by mouth daily. 09/06/22  Yes Shade Flood, MD  meclizine (ANTIVERT) 25 MG tablet Take 1 tablet (25 mg total) by mouth 3 (three) times daily as needed for dizziness. 09/18/22  Yes Shade Flood, MD  sildenafil (VIAGRA) 100 MG tablet TAKE 1/2 TO 1 TABLET BY MOUTH AS NEEDED FOR ERICTILE DYSFUNCTION 09/18/22  Yes Shade Flood, MD   Social History   Socioeconomic History   Marital status: Single    Spouse name: Not on file   Number of children: Not  on file   Years of education: Not on file   Highest education level: Not on file  Occupational History   Not on file  Tobacco Use   Smoking status: Never   Smokeless tobacco: Never  Vaping Use   Vaping status: Never Used  Substance and Sexual Activity   Alcohol use: Yes    Comment: rare   Drug use: No   Sexual activity: Yes  Other Topics Concern   Not on file  Social History Narrative   Not on file   Social Determinants of Health   Financial Resource Strain: Low Risk  (10/24/2022)   Overall Financial Resource Strain (CARDIA)     Difficulty of Paying Living Expenses: Not hard at all  Food Insecurity: No Food Insecurity (10/24/2022)   Hunger Vital Sign    Worried About Running Out of Food in the Last Year: Never true    Ran Out of Food in the Last Year: Never true  Transportation Needs: No Transportation Needs (10/24/2022)   PRAPARE - Administrator, Civil Service (Medical): No    Lack of Transportation (Non-Medical): No  Physical Activity: Sufficiently Active (10/24/2022)   Exercise Vital Sign    Days of Exercise per Week: 6 days    Minutes of Exercise per Session: 80 min  Stress: No Stress Concern Present (10/24/2022)   [employer] of Occupational Health - Occupational Stress Questionnaire    Feeling of Stress : Not at all  Social Connections: Unknown (10/24/2022)   Social Connection and Isolation Panel [NHANES]    Frequency of Communication with Friends and Family: More than three times a week    Frequency of Social Gatherings with Friends and Family: Once a week    Attends Religious Services: More than 4 times per year    Active Member of [employer] or Organizations: Yes    Attends Banker Meetings: 1 to 4 times per year    Marital Status: Not on file  Intimate Partner Violence: Not At Risk (10/24/2022)   Humiliation, Afraid, Rape, and Kick questionnaire    Fear of Current or Ex-Partner: No    Emotionally Abused: No    Physically Abused: No    Sexually Abused: No    Review of Systems  Constitutional:  Negative for fatigue and unexpected weight change.  Eyes:  Negative for visual disturbance.  Respiratory:  Negative for cough, chest tightness and shortness of breath.   Cardiovascular:  Negative for chest pain, palpitations and leg swelling.  Gastrointestinal:  Negative for abdominal pain and blood in stool.  Neurological:  Negative for dizziness, light-headedness and headaches.     Objective:   Vitals:   02/12/23 1448  BP: 120/82  Pulse: 82  Temp: 98 F (36.7 C)  TempSrc:  Temporal  SpO2: 100%  Weight: 165 lb 6 oz (75 kg)  Height: 5' 7.25" (1.708 m)     Physical Exam Vitals reviewed.  Constitutional:      Appearance: He is well-developed.  HENT:     Head: Normocephalic and atraumatic.  Neck:     Vascular: No carotid bruit or JVD.  Cardiovascular:     Rate and Rhythm: Normal rate and regular rhythm.     Heart sounds: Normal heart sounds. No murmur heard. Pulmonary:     Effort: Pulmonary effort is normal.     Breath sounds: Normal breath sounds. No rales.  Musculoskeletal:     Right lower leg: No edema.     Left lower  leg: No edema.  Skin:    General: Skin is warm and dry.  Neurological:     Mental Status: He is alert and oriented to person, place, and time.  Psychiatric:        Mood and Affect: Mood normal.        Assessment & Plan:  Judy Germer is a 72 y.o. male . Essential hypertension, benign - Plan: Comprehensive metabolic panel  - .  Tolerating current med regimen, check labs, continue same.  Need for vaccination - Plan: Flu Vaccine Trivalent High Dose (Fluad)  Screening for STD (sexually transmitted disease) - Plan: Urine cytology ancillary only, RPR, HIV Antibody (routine testing w rflx), CANCELED: HIV Antibody (routine testing w rflx), CANCELED: Herpes simplex virus culture  -Asymptomatic, routine screening as above.  Benign prostatic hyperplasia with nocturia  -Stable, no med changes at this time.  RTC precautions.  Vestibular migraine  -As needed needed, overall improved.  Could consider meeting with neurology through [place] if that is easier.  Elevated LDL cholesterol level - Plan: Comprehensive metabolic panel, Lipid panel Check labs and adjust plan/med recommendations accordingly.  Erectile dysfunction, unspecified erectile dysfunction type Stable with current dose of sildenafil, continue same. viagra Rx given - use lowest effective dose. Side effects discussed (including but not limited to headache/flushing, blue  discoloration of vision, possible vascular steal and risk of cardiac effects if underlying unknown coronary artery disease, and permanent sensorineural hearing loss). Understanding expressed.  Encounter for screening for HIV - Plan: HIV Antibody (routine testing w rflx)   No orders of the defined types were placed in this encounter.  Patient Instructions  No med changes at this time.  I will let you know if any concerns on labs.  Take care    Signed,   Meredith Staggers, MD [facility], [facility] 02/12/23 3:42 PM

## 2023-02-12 NOTE — Patient Instructions (Signed)
No med changes at this time.  I will let you know if any concerns on labs. Take care!

## 2023-02-13 LAB — COMPREHENSIVE METABOLIC PANEL
ALT: 19 U/L (ref 0–53)
AST: 28 U/L (ref 0–37)
Albumin: 4.1 g/dL (ref 3.5–5.2)
Alkaline Phosphatase: 56 U/L (ref 39–117)
BUN: 20 mg/dL (ref 6–23)
CO2: 29 meq/L (ref 19–32)
Calcium: 9.7 mg/dL (ref 8.4–10.5)
Chloride: 103 meq/L (ref 96–112)
Creatinine, Ser: 1.39 mg/dL (ref 0.40–1.50)
GFR: 50.68 mL/min — ABNORMAL LOW (ref >60.00)
Glucose, Bld: 86 mg/dL (ref 70–99)
Potassium: 4.4 meq/L (ref 3.5–5.1)
Sodium: 139 meq/L (ref 135–145)
Total Bilirubin: 0.5 mg/dL (ref 0.2–1.2)
Total Protein: 7.4 g/dL (ref 6.0–8.3)

## 2023-02-13 LAB — LIPID PANEL
Cholesterol: 197 mg/dL (ref 0–200)
HDL: 64.8 mg/dL (ref >39.00)
LDL Cholesterol: 114 mg/dL — ABNORMAL HIGH (ref 0–99)
NonHDL: 132.42
Total CHOL/HDL Ratio: 3
Triglycerides: 92 mg/dL (ref 0.0–149.0)
VLDL: 18.4 mg/dL (ref 0.0–40.0)

## 2023-02-14 LAB — RPR: RPR Ser Ql: NONREACTIVE

## 2023-02-14 LAB — URINE CYTOLOGY ANCILLARY ONLY
Chlamydia: NEGATIVE
Comment: NEGATIVE
Comment: NEGATIVE
Comment: NORMAL
Neisseria Gonorrhea: NEGATIVE
Trichomonas: NEGATIVE

## 2023-02-14 LAB — HIV ANTIBODY (ROUTINE TESTING W REFLEX): HIV 1&2 Ab, 4th Generation: NONREACTIVE

## 2023-03-26 ENCOUNTER — Other Ambulatory Visit: Payer: Self-pay | Admitting: Family Medicine

## 2023-03-26 DIAGNOSIS — N529 Male erectile dysfunction, unspecified: Secondary | ICD-10-CM

## 2023-03-26 NOTE — Telephone Encounter (Signed)
I see the rx sent to the pharmacy was for 5 tablets and the request came through for 25 tablets. Just wanted to confirm.

## 2023-08-13 ENCOUNTER — Encounter: Payer: Medicare Other | Admitting: Family Medicine

## 2023-08-20 ENCOUNTER — Encounter: Payer: Medicare Other | Admitting: Family Medicine

## 2023-08-30 ENCOUNTER — Other Ambulatory Visit: Payer: Self-pay | Admitting: Family Medicine

## 2023-08-30 DIAGNOSIS — N529 Male erectile dysfunction, unspecified: Secondary | ICD-10-CM

## 2023-09-19 ENCOUNTER — Ambulatory Visit (INDEPENDENT_AMBULATORY_CARE_PROVIDER_SITE_OTHER): Payer: Medicare Other | Admitting: Family Medicine

## 2023-09-19 ENCOUNTER — Other Ambulatory Visit: Payer: Self-pay | Admitting: Family Medicine

## 2023-09-19 ENCOUNTER — Encounter: Payer: Self-pay | Admitting: Family Medicine

## 2023-09-19 VITALS — BP 136/70 | HR 70 | Temp 97.7°F | Ht 67.25 in | Wt 163.6 lb

## 2023-09-19 DIAGNOSIS — N529 Male erectile dysfunction, unspecified: Secondary | ICD-10-CM | POA: Diagnosis not present

## 2023-09-19 DIAGNOSIS — E7800 Pure hypercholesterolemia, unspecified: Secondary | ICD-10-CM | POA: Diagnosis not present

## 2023-09-19 DIAGNOSIS — I10 Essential (primary) hypertension: Secondary | ICD-10-CM

## 2023-09-19 DIAGNOSIS — R42 Dizziness and giddiness: Secondary | ICD-10-CM | POA: Diagnosis not present

## 2023-09-19 DIAGNOSIS — N401 Enlarged prostate with lower urinary tract symptoms: Secondary | ICD-10-CM | POA: Diagnosis not present

## 2023-09-19 DIAGNOSIS — R351 Nocturia: Secondary | ICD-10-CM | POA: Diagnosis not present

## 2023-09-19 DIAGNOSIS — G43809 Other migraine, not intractable, without status migrainosus: Secondary | ICD-10-CM

## 2023-09-19 DIAGNOSIS — H18419 Arcus senilis, unspecified eye: Secondary | ICD-10-CM | POA: Insufficient documentation

## 2023-09-19 LAB — CBC
HCT: 41.1 % (ref 39.0–52.0)
Hemoglobin: 13.1 g/dL (ref 13.0–17.0)
MCHC: 31.9 g/dL (ref 30.0–36.0)
MCV: 84.3 fl (ref 78.0–100.0)
Platelets: 188 10*3/uL (ref 150.0–400.0)
RBC: 4.88 Mil/uL (ref 4.22–5.81)
RDW: 13.7 % (ref 11.5–15.5)
WBC: 3.4 10*3/uL — ABNORMAL LOW (ref 4.0–10.5)

## 2023-09-19 LAB — LIPID PANEL
Cholesterol: 187 mg/dL (ref 0–200)
HDL: 71.7 mg/dL (ref >39.00)
LDL Cholesterol: 102 mg/dL — ABNORMAL HIGH (ref 0–99)
NonHDL: 115.24
Total CHOL/HDL Ratio: 3
Triglycerides: 65 mg/dL (ref 0.0–149.0)
VLDL: 13 mg/dL (ref 0.0–40.0)

## 2023-09-19 LAB — COMPREHENSIVE METABOLIC PANEL WITH GFR
ALT: 22 U/L (ref 0–53)
AST: 27 U/L (ref 0–37)
Albumin: 4.2 g/dL (ref 3.5–5.2)
Alkaline Phosphatase: 60 U/L (ref 39–117)
BUN: 18 mg/dL (ref 6–23)
CO2: 29 meq/L (ref 19–32)
Calcium: 9.5 mg/dL (ref 8.4–10.5)
Chloride: 103 meq/L (ref 96–112)
Creatinine, Ser: 1.32 mg/dL (ref 0.40–1.50)
GFR: 53.7 mL/min — ABNORMAL LOW (ref >60.00)
Glucose, Bld: 82 mg/dL (ref 70–99)
Potassium: 4 meq/L (ref 3.5–5.1)
Sodium: 139 meq/L (ref 135–145)
Total Bilirubin: 0.7 mg/dL (ref 0.2–1.2)
Total Protein: 7.5 g/dL (ref 6.0–8.3)

## 2023-09-19 MED ORDER — SILDENAFIL CITRATE 100 MG PO TABS: ORAL_TABLET | ORAL | 1 refills | Status: DC

## 2023-09-19 MED ORDER — MECLIZINE HCL 25 MG PO TABS: 25.0000 mg | ORAL_TABLET | Freq: Three times a day (TID) | ORAL | 2 refills | Status: AC | PRN

## 2023-09-19 MED ORDER — LISINOPRIL 10 MG PO TABS: 5.0000 mg | ORAL_TABLET | Freq: Every day | ORAL | 3 refills | Status: AC

## 2023-09-19 MED ORDER — FINASTERIDE 5 MG PO TABS: ORAL_TABLET | ORAL | 3 refills | Status: AC

## 2023-09-19 NOTE — Progress Notes (Signed)
 Subjective:  Patient ID: Darrell Wells, male    DOB: [birth year]  Age: 73 y.o. MRN: [id number]  CC:  Chief Complaint  Patient presents with   Annual Exam    Pt notes he is doing well no concerns, pt is fasting     HPI Darrell Wells presents for Annual Exam - med review.  PCP, [facility] Dr. Tellis Feathers, history of vestibular migraine, sensorineural hearing loss. Normal vestibular testing in 2023, with exception of increased saccadic latency.  Has use meclizine  as needed for intermittent vertigo. Rare flare - last one few months ago improved with meclizine .   Hypertension: On lisinopril  5 mg daily without any side effects. Home readings: average 138-140/85 BP Readings from Last 3 Encounters:  09/19/23 136/70  02/12/23 120/82  09/18/22 124/70   Lab Results  Component Value Date   CREATININE 1.39 02/12/2023   BPH Treated with finasteride  5 mg daily.  Working well. Has taken Viagra  as needed for erectile dysfunction, half of the 100 mg pill without new vision, hearing changes, chest pain or dyspnea with exertion and effective with current dose.  No side effects.      09/19/2023    8:04 AM 02/12/2023    2:47 PM 10/24/2022   11:08 AM 09/18/2022    9:11 AM 10/18/2021    8:25 AM  Depression screen PHQ 2/9  Decreased Interest 0 0 0 0 0  Down, Depressed, Hopeless 0 0 0 0 0  PHQ - 2 Score 0 0 0 0 0  Altered sleeping 0  0 0   Tired, decreased energy 0  0 0   Change in appetite 0  0 0   Feeling bad or failure about yourself  0  0 0   Trouble concentrating 0  0 0   Moving slowly or fidgety/restless 0  0 0   Suicidal thoughts 0  0 0   PHQ-9 Score 0  0 0     Health Maintenance  Topic Date Due   Zoster Vaccines- Shingrix (2 of 2) 10/18/2020   DTaP/Tdap/Td (6 - Td or Tdap) 07/31/2021   COVID-19 Vaccine (6 - 2024-25 season) 12/29/2022   Medicare Annual Wellness (AWV)  10/24/2023   INFLUENZA VACCINE  11/28/2023   Pneumonia Vaccine 5+ Years old  Completed   Hepatitis C Screening  Completed    HPV VACCINES  Aged Out   Meningococcal B Vaccine  Aged Out  Colonoscopy 06/09/2018, Dr. Dominic Friendly, diverticulosis, no further testing needed given absence of colonic polyps. BPH treatment as above.  Immunization History  Administered Date(s) Administered   Anthrax 07/16/2005, 09/16/2005, 05/20/2006   Fluad Quad(high Dose 65+) 01/11/2019, 02/23/2020, 02/28/2022   Fluad Trivalent(High Dose 65+) 02/12/2023   Hep B, Unspecified 09/16/2005   Hepatitis A, Adult 06/21/2005, 05/20/2006   Hepatitis B, ADULT 07/16/2005, 09/17/2005, 05/20/2006   IPV 05/20/2006   Influenza, High Dose Seasonal PF 07/10/2018   Influenza, Seasonal, Injecte, Preservative Fre 01/26/2010, 01/21/2011   Influenza,inj,Quad PF,6+ Mos 05/03/2014, 05/09/2015, 04/18/2016, 05/26/2017   Influenza-Unspecified 03/20/2004, 06/21/2005, 03/27/2006, 01/31/2007, 02/13/2008, 02/14/2009   MMR 02/08/2010, 01/14/2011   Moderna Sars-Covid-2 Vaccination 11/09/2020   Novel Infuenza-h1n1-09 07/16/2008   PFIZER(Purple Top)SARS-COV-2 Vaccination 06/28/2019, 07/21/2019, 02/10/2020   Pfizer Covid-19 Vaccine Bivalent Booster 59yrs & up 03/16/2021   Pneumococcal Conjugate-13 05/23/2016   Pneumococcal Polysaccharide-23 05/26/2017   Smallpox 07/16/2005   Td 06/21/2005   Td (Adult),5 Lf Tetanus Toxid, Preservative Free 08/01/2011   Td (Adult),unspecified 07/24/2011   Tdap 02/08/2010, 01/21/2011   Typhoid Inactivated  06/21/2005   Zoster Recombinant(Shingrix) 08/23/2020  Tdap and shingles at pharmacy planned.  COVID booster - plans at pharmacy.   No results found.  No corrective lenses, no vision changes, rare need for readers.    Dental: every 6 mo  Alcohol: 2 per week.   Tobacco: none.   Exercise: running 4 days per week. Some resistance exercise as well. 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     Hyperlipidemia: Elevated LDL prior - diet/exercise approach.  Lab Results  Component Value Date   CHOL 197 02/12/2023   HDL 64.80  02/12/2023   LDLCALC 114 (H) 02/12/2023   TRIG 92.0 02/12/2023   CHOLHDL 3 02/12/2023   Lab Results  Component Value Date   ALT 19 02/12/2023   AST 28 02/12/2023   ALKPHOS 56 02/12/2023   BILITOT 0.5 02/12/2023    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   Presbyopia 02/11/2023   Regular astigmatism 02/11/2023   Screening for hypertension 02/11/2023   Senile corneal changes 02/11/2023   Encounter for other administrative examinations 02/11/2023   Vestibular migraine 02/11/2023   Vertigo 09/11/2021   Essential hypertension, benign 05/03/2014   BPH (benign prostatic hyperplasia) 05/03/2014   Erectile dysfunction 05/03/2014   Past Medical History:  Diagnosis Date   Enlarged prostate    Hypertension    Vertigo    Past Surgical History:  Procedure Laterality Date   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 Authorizing Provider  finasteride  (PROSCAR ) 5 MG tablet TAKE 1 TABLET(5 MG) BY MOUTH DAILY 11/15/22  Yes Benjiman Bras, MD  fluticasone  (FLONASE ) 50 MCG/ACT nasal spray SHAKE LIQUID AND USE 2 SPRAYS IN EACH NOSTRIL DAILY 02/27/22  Yes Benjiman Bras, MD  lisinopril  (ZESTRIL ) 10 MG tablet Take 0.5 tablets (5 mg total) by mouth daily. 09/06/22  Yes Benjiman Bras, MD  meclizine  (ANTIVERT ) 25 MG tablet Take 1 tablet (25 mg total) by mouth 3 (three) times daily as needed for dizziness. 09/18/22  Yes Benjiman Bras, MD  sildenafil  (VIAGRA ) 100 MG tablet TAKE 1/2 TO 1 TABLET BY MOUTH AS NEEDED FOR ERECTILE DYSFUNCTION 03/26/23  Yes Benjiman Bras, MD   Social History   Socioeconomic History   Marital status: Single    Spouse name: Not on file   Number of children: Not on  file   Years of education: Not on file   Highest education level: Some college, no degree  Occupational History   Not on file  Tobacco Use   Smoking status: Never   Smokeless tobacco: Never  Vaping Use   Vaping status: Never Used  Substance and Sexual Activity   Alcohol use: Yes    Comment: rare   Drug use: No   Sexual activity: Yes  Other Topics Concern   Not on file  Social History Narrative   Not on file   Social Drivers of Health   Financial Resource Strain: Low Risk  (09/15/2023)   Overall Financial Resource Strain (CARDIA)    Difficulty of Paying Living Expenses: Not hard at all  Food Insecurity: No Food Insecurity (09/15/2023)   Hunger Vital Sign    Worried About Running Out of Food in the Last  Year: Never true    Ran Out of Food in the Last Year: Never true  Transportation Needs: No Transportation Needs (09/15/2023)   PRAPARE - Administrator, Civil Service (Medical): No    Lack of Transportation (Non-Medical): No  Physical Activity: Sufficiently Active (09/15/2023)   Exercise Vital Sign    Days of Exercise per Week: 6 days    Minutes of Exercise per Session: 150+ min  Stress: No Stress Concern Present (09/15/2023)   [employer] of Occupational Health - Occupational Stress Questionnaire    Feeling of Stress : Not at all  Social Connections: Moderately Integrated (09/15/2023)   Social Connection and Isolation Panel [NHANES]    Frequency of Communication with Friends and Family: More than three times a week    Frequency of Social Gatherings with Friends and Family: More than three times a week    Attends Religious Services: More than 4 times per year    Active Member of [employer] or Organizations: Yes    Attends Banker Meetings: 1 to 4 times per year    Marital Status: Divorced  Intimate Partner Violence: Not At Risk (10/24/2022)   Humiliation, Afraid, Rape, and Kick questionnaire    Fear of Current or Ex-Partner: No    Emotionally Abused:  No    Physically Abused: No    Sexually Abused: No    Review of Systems 13 point review of systems per patient health survey noted.  Negative other than as indicated above or in HPI.    Objective:   Vitals:   09/19/23 0800  BP: 136/70  Pulse: 70  Temp: 97.7 F (36.5 C)  TempSrc: Temporal  SpO2: 100%  Weight: 163 lb 9.6 oz (74.2 kg)  Height: 5' 7.25" (1.708 m)     Physical Exam Vitals reviewed.  Constitutional:      Appearance: He is well-developed.  HENT:     Head: Normocephalic and atraumatic.     Right Ear: External ear normal.     Left Ear: External ear normal.  Eyes:     Conjunctiva/sclera: Conjunctivae normal.     Pupils: Pupils are equal, round, and reactive to light.  Neck:     Thyroid : No thyromegaly.  Cardiovascular:     Rate and Rhythm: Normal rate and regular rhythm.     Heart sounds: Normal heart sounds.  Pulmonary:     Effort: Pulmonary effort is normal. No respiratory distress.     Breath sounds: Normal breath sounds. No wheezing.  Abdominal:     General: There is no distension.     Palpations: Abdomen is soft.     Tenderness: There is no abdominal tenderness.  Musculoskeletal:        General: No tenderness. Normal range of motion.     Cervical back: Normal range of motion and neck supple.  Lymphadenopathy:     Cervical: No cervical adenopathy.  Skin:    General: Skin is warm and dry.  Neurological:     Mental Status: He is alert and oriented to person, place, and time.     Deep Tendon Reflexes: Reflexes are normal and symmetric.  Psychiatric:        Behavior: Behavior normal.        Assessment & Plan:  Darrell Wells is a 73 y.o. male . Essential hypertension, benign - Plan: Comprehensive metabolic panel with GFR, CBC, lisinopril  (ZESTRIL ) 10 MG tablet  - Tolerating current meds, borderline blood pressure, okay to stay at 5  mg for now but if he does have higher readings at home option to increase to 10 mg and let me know if he makes  that change.  Check labs and adjust plan accordingly.  Continue to stay active.  Benign prostatic hyperplasia with nocturia - Plan: finasteride  (PROSCAR ) 5 MG tablet  - Stable with finasteride , continue same  Vestibular migraine Vertigo - Plan: meclizine  (ANTIVERT ) 25 MG tablet - Stable, has meclizine  if needed, infrequent need.  Continue to monitor.  Elevated LDL cholesterol level - Plan: Comprehensive metabolic panel with GFR, Lipid panel  - Check labs, adjust plan accordingly.  Hold on statin or new meds for now.  Continue to stay active as above.  Erectile dysfunction, unspecified erectile dysfunction type - Plan: sildenafil  (VIAGRA ) 100 MG tablet  -viagra  Rx given - use lowest effective dose. Side effects discussed (including but not limited to headache/flushing, blue discoloration of vision, possible vascular steal and risk of cardiac effects if underlying unknown coronary artery disease, and permanent sensorineural hearing loss). Understanding expressed.     Meds ordered this encounter  Medications   finasteride  (PROSCAR ) 5 MG tablet    Sig: TAKE 1 TABLET(5 MG) BY MOUTH DAILY    Dispense:  90 tablet    Refill:  3   lisinopril  (ZESTRIL ) 10 MG tablet    Sig: Take 0.5 tablets (5 mg total) by mouth daily.    Dispense:  45 tablet    Refill:  3   sildenafil  (VIAGRA ) 100 MG tablet    Sig: TAKE 1/2 TO 1 TABLET BY MOUTH AS NEEDED FOR ERECTILE DYSFUNCTION    Dispense:  25 tablet    Refill:  1   meclizine  (ANTIVERT ) 25 MG tablet    Sig: Take 1 tablet (25 mg total) by mouth 3 (three) times daily as needed for dizziness.    Dispense:  30 tablet    Refill:  2   Patient Instructions  Great talking with you today.  I think you are doing well.  No change in meds at this time.  If you do notice some higher blood pressures at home, can increase the lisinopril  to 10 mg or a full pill, and let me know if you make that change.  If any concerns on labs I will let you know.  Have a great summer  and enjoy Disney!   Preventive Care 42 Years and Older, Male Preventive care refers to lifestyle choices and visits with your health care provider that can promote health and wellness. Preventive care visits are also called wellness exams. What can I expect for my preventive care visit? Counseling During your preventive care visit, your health care provider may ask about your: Medical history, including: Past medical problems. Family medical history. History of falls. Current health, including: Emotional well-being. Home life and relationship well-being. Sexual activity. Memory and ability to understand (cognition). Lifestyle, including: Alcohol, nicotine or tobacco, and drug use. Access to firearms. Diet, exercise, and sleep habits. Work and work Astronomer. Sunscreen use. Safety issues such as seatbelt and bike helmet use. Physical exam Your health care provider will check your: Height and weight. These may be used to calculate your BMI (body mass index). BMI is a measurement that tells if you are at a healthy weight. Waist circumference. This measures the distance around your waistline. This measurement also tells if you are at a healthy weight and may help predict your risk of certain diseases, such as type 2 diabetes and high blood pressure. Heart rate  and blood pressure. Body temperature. Skin for abnormal spots. What immunizations do I need?  Vaccines are usually given at various ages, according to a schedule. Your health care provider will recommend vaccines for you based on your age, medical history, and lifestyle or other factors, such as travel or where you work. What tests do I need? Screening Your health care provider may recommend screening tests for certain conditions. This may include: Lipid and cholesterol levels. Diabetes screening. This is done by checking your blood sugar (glucose) after you have not eaten for a while (fasting). Hepatitis C test. Hepatitis  B test. HIV (human immunodeficiency virus) test. STI (sexually transmitted infection) testing, if you are at risk. Lung cancer screening. Colorectal cancer screening. Prostate cancer screening. Abdominal aortic aneurysm (AAA) screening. You may need this if you are a current or former smoker. Talk with your health care provider about your test results, treatment options, and if necessary, the need for more tests. Follow these instructions at home: Eating and drinking  Eat a diet that includes fresh fruits and vegetables, whole grains, lean protein, and low-fat dairy products. Limit your intake of foods with high amounts of sugar, saturated fats, and salt. Take vitamin and mineral supplements as recommended by your health care provider. Do not drink alcohol if your health care provider tells you not to drink. If you drink alcohol: Limit how much you have to 0-2 drinks a day. Know how much alcohol is in your drink. In the [place] one drink equals one 12 oz bottle of beer (355 mL), one 5 oz glass of wine (148 mL), or one 1 oz glass of hard liquor (44 mL). Lifestyle Brush your teeth every morning and night with fluoride toothpaste. Floss one time each day. Exercise for at least 30 minutes 5 or more days each week. Do not use any products that contain nicotine or tobacco. These products include cigarettes, chewing tobacco, and vaping devices, such as e-cigarettes. If you need help quitting, ask your health care provider. Do not use drugs. If you are sexually active, practice safe sex. Use a condom or other form of protection to prevent STIs. Take aspirin only as told by your health care provider. Make sure that you understand how much to take and what form to take. Work with your health care provider to find out whether it is safe and beneficial for you to take aspirin daily. Ask your health care provider if you need to take a cholesterol-lowering medicine (statin). Find healthy ways to manage  stress, such as: Meditation, yoga, or listening to music. Journaling. Talking to a trusted person. Spending time with friends and family. Safety Always wear your seat belt while driving or riding in a vehicle. Do not drive: If you have been drinking alcohol. Do not ride with someone who has been drinking. When you are tired or distracted. While texting. If you have been using any mind-altering substances or drugs. Wear a helmet and other protective equipment during sports activities. If you have firearms in your house, make sure you follow all gun safety procedures. Minimize exposure to UV radiation to reduce your risk of skin cancer. What's next? Visit your health care provider once a year for an annual wellness visit. Ask your health care provider how often you should have your eyes and teeth checked. Stay up to date on all vaccines. This information is not intended to replace advice given to you by your health care provider. Make sure you discuss any questions you  have with your health care provider. Document Revised: 10/11/2020 Document Reviewed: 10/11/2020 Elsevier Patient Education  2024 Elsevier Inc.    Signed,   Caro Christmas, MD [facility], [facility] 09/19/23 8:33 AM

## 2023-09-19 NOTE — Patient Instructions (Signed)
 Great talking with you today.  I think you are doing well.  No change in meds at this time.  If you do notice some higher blood pressures at home, can increase the lisinopril  to 10 mg or a full pill, and let me know if you make that change.  If any concerns on labs I will let you know.  Have a great summer and enjoy [place]!   Preventive Care 10 Years and Older, Male Preventive care refers to lifestyle choices and visits with your health care provider that can promote health and wellness. Preventive care visits are also called wellness exams. What can I expect for my preventive care visit? Counseling During your preventive care visit, your health care provider may ask about your: Medical history, including: Past medical problems. Family medical history. History of falls. Current health, including: Emotional well-being. Home life and relationship well-being. Sexual activity. Memory and ability to understand (cognition). Lifestyle, including: Alcohol, nicotine or tobacco, and drug use. Access to firearms. Diet, exercise, and sleep habits. Work and work Astronomer. Sunscreen use. Safety issues such as seatbelt and bike helmet use. Physical exam Your health care provider will check your: Height and weight. These may be used to calculate your BMI (body mass index). BMI is a measurement that tells if you are at a healthy weight. Waist circumference. This measures the distance around your waistline. This measurement also tells if you are at a healthy weight and may help predict your risk of certain diseases, such as type 2 diabetes and high blood pressure. Heart rate and blood pressure. Body temperature. Skin for abnormal spots. What immunizations do I need?  Vaccines are usually given at various ages, according to a schedule. Your health care provider will recommend vaccines for you based on your age, medical history, and lifestyle or other factors, such as travel or where you work. What  tests do I need? Screening Your health care provider may recommend screening tests for certain conditions. This may include: Lipid and cholesterol levels. Diabetes screening. This is done by checking your blood sugar (glucose) after you have not eaten for a while (fasting). Hepatitis C test. Hepatitis B test. HIV (human immunodeficiency virus) test. STI (sexually transmitted infection) testing, if you are at risk. Lung cancer screening. Colorectal cancer screening. Prostate cancer screening. Abdominal aortic aneurysm (AAA) screening. You may need this if you are a current or former smoker. Talk with your health care provider about your test results, treatment options, and if necessary, the need for more tests. Follow these instructions at home: Eating and drinking  Eat a diet that includes fresh fruits and vegetables, whole grains, lean protein, and low-fat dairy products. Limit your intake of foods with high amounts of sugar, saturated fats, and salt. Take vitamin and mineral supplements as recommended by your health care provider. Do not drink alcohol if your health care provider tells you not to drink. If you drink alcohol: Limit how much you have to 0-2 drinks a day. Know how much alcohol is in your drink. In the [place] one drink equals one 12 oz bottle of beer (355 mL), one 5 oz glass of wine (148 mL), or one 1 oz glass of hard liquor (44 mL). Lifestyle Brush your teeth every morning and night with fluoride toothpaste. Floss one time each day. Exercise for at least 30 minutes 5 or more days each week. Do not use any products that contain nicotine or tobacco. These products include cigarettes, chewing tobacco, and vaping devices, such  as e-cigarettes. If you need help quitting, ask your health care provider. Do not use drugs. If you are sexually active, practice safe sex. Use a condom or other form of protection to prevent STIs. Take aspirin only as told by your health care provider.  Make sure that you understand how much to take and what form to take. Work with your health care provider to find out whether it is safe and beneficial for you to take aspirin daily. Ask your health care provider if you need to take a cholesterol-lowering medicine (statin). Find healthy ways to manage stress, such as: Meditation, yoga, or listening to music. Journaling. Talking to a trusted person. Spending time with friends and family. Safety Always wear your seat belt while driving or riding in a vehicle. Do not drive: If you have been drinking alcohol. Do not ride with someone who has been drinking. When you are tired or distracted. While texting. If you have been using any mind-altering substances or drugs. Wear a helmet and other protective equipment during sports activities. If you have firearms in your house, make sure you follow all gun safety procedures. Minimize exposure to UV radiation to reduce your risk of skin cancer. What's next? Visit your health care provider once a year for an annual wellness visit. Ask your health care provider how often you should have your eyes and teeth checked. Stay up to date on all vaccines. This information is not intended to replace advice given to you by your health care provider. Make sure you discuss any questions you have with your health care provider. Document Revised: 10/11/2020 Document Reviewed: 10/11/2020 Elsevier Patient Education  2024 [employer].

## 2023-09-23 ENCOUNTER — Ambulatory Visit: Payer: Self-pay | Admitting: Family Medicine

## 2024-01-13 ENCOUNTER — Ambulatory Visit (INDEPENDENT_AMBULATORY_CARE_PROVIDER_SITE_OTHER)

## 2024-01-13 VITALS — Ht 69.0 in | Wt 161.0 lb

## 2024-01-13 DIAGNOSIS — Z0000 Encounter for general adult medical examination without abnormal findings: Secondary | ICD-10-CM

## 2024-01-13 NOTE — Progress Notes (Signed)
 Subjective:   Darrell Wells is a 73 y.o. male who presents for Medicare Annual/Subsequent preventive examination.  Visit Complete: Virtual I connected with  Darrell Wells on 01/13/24 by a audio enabled telemedicine application and verified that I am speaking with the correct person using two identifiers.  Patient Location: Home  Provider Location: Home Office  I discussed the limitations of evaluation and management by telemedicine. The patient expressed understanding and agreed to proceed.  Vital Signs: Because this visit was a virtual/telehealth visit, some criteria may be missing or patient reported. Any vitals not documented were not able to be obtained and vitals that have been documented are patient reported.   Cardiac Risk Factors include: advanced age (>4men, >23 women);hypertension;male gender     Objective:    Today's Vitals   01/13/24 1258  Weight: 161 lb (73 kg)  Height: 5' 9 (1.753 m)   Body mass index is 23.78 kg/m.     01/13/2024    1:18 PM 10/24/2022   11:04 AM 10/18/2021    8:24 AM 09/14/2021    9:34 AM 08/23/2020    8:34 AM 08/23/2019   11:23 AM 08/18/2018    1:07 PM  Advanced Directives  Does Patient Have a Medical Advance Directive? Yes Yes Yes Yes Yes Yes No;Yes  Type of Estate agent of [place];Living will Healthcare Power of [employer] of [place];Living will  Living will;Healthcare Power of [employer] Power of [place];Living will Living will;Healthcare Power of Attorney  Does patient want to make changes to medical advance directive? No - Patient declined   No - Patient declined No - Patient declined No - Patient declined No - Patient declined   Copy of Healthcare Power of Attorney in Chart? No - copy requested No - copy requested No - copy requested   No - copy requested No - copy requested   Would patient like information on creating a medical advance directive?       No - Patient declined      Data  saved with a previous flowsheet row definition    Current Medications (verified) Outpatient Encounter Medications as of 01/13/2024  Medication Sig   finasteride  (PROSCAR ) 5 MG tablet TAKE 1 TABLET(5 MG) BY MOUTH DAILY   fluticasone  (FLONASE ) 50 MCG/ACT nasal spray SHAKE LIQUID AND USE 2 SPRAYS IN EACH NOSTRIL DAILY   lisinopril  (ZESTRIL ) 10 MG tablet Take 0.5 tablets (5 mg total) by mouth daily.   meclizine  (ANTIVERT ) 25 MG tablet Take 1 tablet (25 mg total) by mouth 3 (three) times daily as needed for dizziness.   sildenafil  (VIAGRA ) 100 MG tablet TAKE 1/2 TO 1 TABLET BY MOUTH AS NEEDED FOR ERECTILE DYSFUNCTION   No facility-administered encounter medications on file as of 01/13/2024.    Allergies (verified) Patient has no known allergies.   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   REPAIR KNEE LIGAMENT Left    Family History  Problem Relation Age of Onset   Stroke Mother    Heart disease Brother    Colon cancer Neg Hx    Esophageal cancer Neg Hx    Rectal cancer Neg Hx    Stomach cancer Neg Hx    Social History   Socioeconomic History   Marital status: Single    Spouse name:  Not on file   Number of children: Not on file   Years of education: Not on file   Highest education level: Some college, no degree  Occupational History   Not on file  Tobacco Use   Smoking status: Never   Smokeless tobacco: Never  Vaping Use   Vaping status: Never Used  Substance and Sexual Activity   Alcohol use: Yes    Comment: rare   Drug use: No   Sexual activity: Yes  Other Topics Concern   Not on file  Social History Narrative   Not on file   Social Drivers of Health   Financial Resource Strain: Low Risk  (01/13/2024)   Overall Financial Resource Strain (CARDIA)    Difficulty of Paying Living Expenses: Not hard at  all  Food Insecurity: No Food Insecurity (01/13/2024)   Hunger Vital Sign    Worried About Running Out of Food in the Last Year: Never true    Ran Out of Food in the Last Year: Never true  Transportation Needs: No Transportation Needs (01/13/2024)   PRAPARE - Administrator, Civil Service (Medical): No    Lack of Transportation (Non-Medical): No  Physical Activity: Sufficiently Active (01/13/2024)   Exercise Vital Sign    Days of Exercise per Week: 7 days    Minutes of Exercise per Session: 50 min  Stress: No Stress Concern Present (01/13/2024)   [employer] of Occupational Health - Occupational Stress Questionnaire    Feeling of Stress: Not at all  Social Connections: Moderately Integrated (01/13/2024)   Social Connection and Isolation Panel    Frequency of Communication with Friends and Family: More than three times a week    Frequency of Social Gatherings with Friends and Family: Twice a week    Attends Religious Services: More than 4 times per year    Active Member of [employer] or Organizations: Yes    Attends Banker Meetings: 1 to 4 times per year    Marital Status: Divorced    Tobacco Counseling Counseling given: Not Answered   Clinical Intake:  Pre-visit preparation completed: Yes  Pain : No/denies pain     Nutritional Status: BMI of 19-24  Normal Nutritional Risks: None Diabetes: No  How often do you need to have someone help you when you read instructions, pamphlets, or other written materials from your doctor or pharmacy?: 1 - Never What is the last grade level you completed in school?: Bachelors  Interpreter Needed?: No  Information entered by :: Arnette Hoots, CMA   Activities of Daily Living    01/13/2024    1:11 PM  In your present state of health, do you have any difficulty performing the following activities:  Hearing? 0  Vision? 0  Difficulty concentrating or making decisions? 0  Walking or climbing stairs? 0  Dressing  or bathing? 0  Doing errands, shopping? 0  Preparing Food and eating ? N  Using the Toilet? N  In the past six months, have you accidently leaked urine? N  Do you have problems with loss of bowel control? N  Managing your Medications? N  Managing your Finances? N  Housekeeping or managing your Housekeeping? N    Patient Care Team: Levora Reyes SAUNDERS, MD as PCP - General (Family Medicine) Carlie Clark, MD as Consulting Physician (Otolaryngology)  Indicate any recent Medical Services you may have received from other than [facility] providers in the past year (date may be approximate).  Assessment:   This is a routine wellness examination for Darrell Wells.  Hearing/Vision screen Hearing Screening - Comments:: No issues.  Vision Screening - Comments:: No issues-uses readers if needed.   Goals Addressed             This Visit's Progress    Patient Stated       Patient is trying to get back in school. 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        Depression Screen    01/13/2024    1:22 PM 09/19/2023    8:04 AM 02/12/2023    2:47 PM 10/24/2022   11:08 AM 09/18/2022    9:11 AM 10/18/2021    8:25 AM 10/18/2021    8:23 AM  PHQ 2/9 Scores  PHQ - 2 Score 0 0 0 0 0 0 0  PHQ- 9 Score 0 0  0 0      Fall Risk    01/13/2024    1:20 PM 09/19/2023    8:03 AM 02/12/2023    2:47 PM 10/24/2022   11:03 AM 10/20/2022   11:51 AM  Fall Risk   Falls in the past year? 0 0 0 0 0  Number falls in past yr:  0 0  0  Injury with Fall? 0 0 0  0  Risk for fall due to :  No Fall Risks No Fall Risks    Follow up Falls evaluation completed;Education provided Falls evaluation completed Falls evaluation completed      MEDICARE RISK AT HOME: Medicare Risk at Home Any stairs in or around the home?: Yes If so, are there any without handrails?: No Home free of loose throw rugs in walkways, pet beds, electrical cords, etc?: Yes Adequate lighting in your home to reduce risk of  falls?: Yes Life alert?: No Use of a cane, walker or w/c?: No Grab bars in the bathroom?: No Shower chair or bench in shower?: No Elevated toilet seat or a handicapped toilet?: No  TIMED UP AND GO:  Was the test performed?  No    Cognitive Function:        01/13/2024    1:21 PM 10/24/2022   11:05 AM 09/18/2022    9:10 AM 09/14/2021    9:31 AM 08/23/2020    8:35 AM  6CIT Screen  What Year? 0 points 0 points 0 points 0 points 0 points  What month?  0 points 0 points 0 points 0 points  What time? 0 points 0 points 0 points 0 points 0 points  Count back from 20 0 points 0 points 0 points 0 points 0 points  Months in reverse 0 points 0 points 0 points 0 points 0 points  Repeat phrase 0 points 0 points 0 points 0 points 0 points  Total Score  0 points 0 points 0 points 0 points    Immunizations Immunization History  Administered Date(s) Administered   Anthrax 07/16/2005, 09/16/2005, 05/20/2006   Fluad Quad(high Dose 65+) 01/11/2019, 02/23/2020, 02/28/2022   Fluad Trivalent(High Dose 65+) 02/12/2023   Hep B, Unspecified 09/16/2005   Hepatitis A, Adult 06/21/2005, 05/20/2006   Hepatitis B, ADULT 07/16/2005, 09/17/2005, 05/20/2006   INFLUENZA, HIGH DOSE SEASONAL PF 07/10/2018   IPV 05/20/2006   Influenza, Seasonal, Injecte, Preservative Fre 01/26/2010, 01/21/2011   Influenza,inj,Quad PF,6+ Mos 05/03/2014, 05/09/2015, 04/18/2016, 05/26/2017   Influenza-Unspecified 03/20/2004, 06/21/2005, 03/27/2006, 01/31/2007, 02/13/2008, 02/14/2009   MMR 02/08/2010, 01/14/2011   Moderna Sars-Covid-2 Vaccination 11/09/2020  Novel Infuenza-h1n1-09 07/16/2008   PFIZER(Purple Top)SARS-COV-2 Vaccination 06/28/2019, 07/21/2019, 02/10/2020   Pfizer Covid-19 Vaccine Bivalent Booster 31yrs & up 03/16/2021   Pneumococcal Conjugate-13 05/23/2016   Pneumococcal Polysaccharide-23 05/26/2017   Smallpox 07/16/2005   Td 06/21/2005   Td (Adult),5 Lf Tetanus Toxid, Preservative Free 08/01/2011   Td  (Adult),unspecified 07/24/2011   Tdap 02/08/2010, 01/21/2011   Typhoid Inactivated 06/21/2005   Zoster Recombinant(Shingrix) 08/23/2020    TDAP status: Due, Education has been provided regarding the importance of this vaccine. Advised may receive this vaccine at local pharmacy or Health Dept. Aware to provide a copy of the vaccination record if obtained from local pharmacy or Health Dept. Verbalized acceptance and understanding.  Flu Vaccine status: Due, Education has been provided regarding the importance of this vaccine. Advised may receive this vaccine at local pharmacy or Health Dept. Aware to provide a copy of the vaccination record if obtained from local pharmacy or Health Dept. Verbalized acceptance and understanding.  Pneumococcal vaccine status: Up to date  Covid-19 vaccine status: Completed vaccines  Qualifies for Shingles Vaccine? Yes   Zostavax completed No   Shingrix Completed?: No.    Education has been provided regarding the importance of this vaccine. Patient has been advised to call insurance company to determine out of pocket expense if they have not yet received this vaccine. Advised may also receive vaccine at local pharmacy or Health Dept. Verbalized acceptance and understanding.  Screening Tests Health Maintenance  Topic Date Due   Zoster Vaccines- Shingrix (2 of 2) 10/18/2020   DTaP/Tdap/Td (6 - Td or Tdap) 07/31/2021   Medicare Annual Wellness (AWV)  10/24/2023   Influenza Vaccine  11/28/2023   COVID-19 Vaccine (6 - 2025-26 season) 12/29/2023   Pneumococcal Vaccine: 50+ Years  Completed   Hepatitis C Screening  Completed   HPV VACCINES  Aged Out   Meningococcal B Vaccine  Aged Out   Hepatitis B Vaccines 19-59 Average Risk  Discontinued    Health Maintenance  Health Maintenance Due  Topic Date Due   Zoster Vaccines- Shingrix (2 of 2) 10/18/2020   DTaP/Tdap/Td (6 - Td or Tdap) 07/31/2021   Medicare Annual Wellness (AWV)  10/24/2023   Influenza Vaccine   11/28/2023   COVID-19 Vaccine (6 - 2025-26 season) 12/29/2023    Colorectal cancer screening: No longer required.   Lung Cancer Screening: (Low Dose CT Chest recommended if Age 68-80 years, 20 pack-year currently smoking OR have quit w/in 15years.) does not qualify.   Lung Cancer Screening Referral: n/a  Additional Screening:  Hepatitis C Screening: does qualify; Completed 201  Vision Screening: Recommended annual ophthalmology exams for early detection of glaucoma and other disorders of the eye. Is the patient up to date with their annual eye exam?  Yes  Who is the provider or what is the name of the office in which the patient attends annual eye exams?  If pt is not established with a provider, would they like to be referred to a provider to establish care? No .   Dental Screening: Recommended annual dental exams for proper oral hygiene   Community Resource Referral / Chronic Care Management: CRR required this visit?  No   CCM required this visit?  No     Plan:     I have personally reviewed and noted the following in the patient's chart:   Medical and social history Use of alcohol, tobacco or illicit drugs  Current medications and supplements including opioid prescriptions. Patient is not currently taking  opioid prescriptions. Functional ability and status Nutritional status Physical activity Advanced directives List of other physicians Hospitalizations, surgeries, and ER visits in previous 12 months Vitals Screenings to include cognitive, depression, and falls Referrals and appointments  In addition, I have reviewed and discussed with patient certain preventive protocols, quality metrics, and best practice recommendations. A written personalized care plan for preventive services as well as general preventive health recommendations were provided to patient.     Arnette LOISE Hoots, CMA   01/13/2024   After Visit Summary: (MyChart) Due to this being a telephonic  visit, the after visit summary with patients personalized plan was offered to patient via MyChart   Nurse Notes: Patient no longer needs colonoscopy per provider. He is due for Tdap and second shingles. He will obtain both. He will bring a copy of his living will and power of attorney by the office so it can be scanned in.

## 2024-01-13 NOTE — Patient Instructions (Signed)
 Mr. Darrell Wells,  Thank you for taking the time for your Medicare Wellness Visit. I appreciate your continued commitment to your health goals. Please review the care plan we discussed, and feel free to reach out if I can assist you further.  Medicare recommends these wellness visits once per year to help you and your care team stay ahead of potential health issues. These visits are designed to focus on prevention, allowing your provider to concentrate on managing your acute and chronic conditions during your regular appointments.  Please note that Annual Wellness Visits do not include a physical exam. Some assessments may be limited, especially if the visit was conducted virtually. If needed, we may recommend a separate in-person follow-up with your provider.  Ongoing Care Seeing your primary care provider every 3 to 6 months helps us  monitor your health and provide consistent, personalized care.   Referrals If a referral was made during today's visit and you haven't received any updates within two weeks, please contact the referred provider directly to check on the status.  Recommended Screenings:  Health Maintenance  Topic Date Due   Zoster (Shingles) Vaccine (2 of 2) 10/18/2020   DTaP/Tdap/Td vaccine (6 - Td or Tdap) 07/31/2021   Flu Shot  11/28/2023   COVID-19 Vaccine (6 - 2025-26 season) 12/29/2023   Medicare Annual Wellness Visit  01/12/2025   Pneumococcal Vaccine for age over 34  Completed   Hepatitis C Screening  Completed   HPV Vaccine  Aged Out   Meningitis B Vaccine  Aged Out   Hepatitis B Vaccine  Discontinued       01/13/2024    1:18 PM  Advanced Directives  Does Patient Have a Medical Advance Directive? Yes  Type of Estate agent of [place];Living will  Does patient want to make changes to medical advance directive? No - Patient declined  Copy of Healthcare Power of Attorney in Chart? No - copy requested   Advance Care Planning is important because  it: Ensures you receive medical care that aligns with your values, goals, and preferences. Provides guidance to your family and loved ones, reducing the emotional burden of decision-making during critical moments.  Vision: Annual vision screenings are recommended for early detection of glaucoma, cataracts, and diabetic retinopathy. These exams can also reveal signs of chronic conditions such as diabetes and high blood pressure.  Dental: Annual dental screenings help detect early signs of oral cancer, gum disease, and other conditions linked to overall health, including heart disease and diabetes.  Please see the attached documents for additional preventive care recommendations. Please bring in power of attorney and living will so we can put a copy on file. Also obtain an updated Tetanus shot as well as the second shingrix(shingles) vaccine.

## 2024-01-23 ENCOUNTER — Other Ambulatory Visit: Payer: Self-pay | Admitting: Family Medicine

## 2024-01-23 DIAGNOSIS — N529 Male erectile dysfunction, unspecified: Secondary | ICD-10-CM

## 2024-03-03 ENCOUNTER — Ambulatory Visit

## 2024-03-22 ENCOUNTER — Ambulatory Visit: Admitting: Family Medicine

## 2024-04-02 ENCOUNTER — Ambulatory Visit: Admitting: Family Medicine

## 2024-04-07 ENCOUNTER — Ambulatory Visit: Admitting: Family Medicine

## 2024-04-07 VITALS — BP 128/76 | HR 65 | Temp 98.0°F | Resp 16 | Ht 69.0 in | Wt 165.0 lb

## 2024-04-07 DIAGNOSIS — N529 Male erectile dysfunction, unspecified: Secondary | ICD-10-CM | POA: Diagnosis not present

## 2024-04-07 DIAGNOSIS — I10 Essential (primary) hypertension: Secondary | ICD-10-CM | POA: Diagnosis not present

## 2024-04-07 DIAGNOSIS — N401 Enlarged prostate with lower urinary tract symptoms: Secondary | ICD-10-CM | POA: Diagnosis not present

## 2024-04-07 DIAGNOSIS — R351 Nocturia: Secondary | ICD-10-CM

## 2024-04-07 DIAGNOSIS — E7800 Pure hypercholesterolemia, unspecified: Secondary | ICD-10-CM | POA: Diagnosis not present

## 2024-04-07 LAB — COMPREHENSIVE METABOLIC PANEL WITH GFR
ALT: 16 U/L (ref 0–53)
AST: 25 U/L (ref 0–37)
Albumin: 4.2 g/dL (ref 3.5–5.2)
Alkaline Phosphatase: 65 U/L (ref 39–117)
BUN: 17 mg/dL (ref 6–23)
CO2: 29 meq/L (ref 19–32)
Calcium: 9.5 mg/dL (ref 8.4–10.5)
Chloride: 102 meq/L (ref 96–112)
Creatinine, Ser: 1.32 mg/dL (ref 0.40–1.50)
GFR: 53.49 mL/min — ABNORMAL LOW (ref >60.00)
Glucose, Bld: 79 mg/dL (ref 70–99)
Potassium: 4.5 meq/L (ref 3.5–5.1)
Sodium: 138 meq/L (ref 135–145)
Total Bilirubin: 0.6 mg/dL (ref 0.2–1.2)
Total Protein: 7.2 g/dL (ref 6.0–8.3)

## 2024-04-07 LAB — LIPID PANEL
Cholesterol: 201 mg/dL — ABNORMAL HIGH (ref 0–200)
HDL: 64.7 mg/dL (ref >39.00)
LDL Cholesterol: 116 mg/dL — ABNORMAL HIGH (ref 0–99)
NonHDL: 136.31
Total CHOL/HDL Ratio: 3
Triglycerides: 101 mg/dL (ref 0.0–149.0)
VLDL: 20.2 mg/dL (ref 0.0–40.0)

## 2024-04-07 LAB — PSA: PSA: 0.56 ng/mL (ref 0.10–4.00)

## 2024-04-07 MED ORDER — SILDENAFIL CITRATE 100 MG PO TABS: ORAL_TABLET | ORAL | 1 refills | Status: AC

## 2024-04-07 NOTE — Progress Notes (Signed)
 Subjective:  Patient ID: Darrell Wells, male    DOB: Aug 12, 1950  Age: 73 y.o. MRN: [id number]  CC:  Chief Complaint  Patient presents with   Hypertension    6 month follow up    HPI Darrell Wells presents for follow up   Hypertension: Lisinopril  5mg  every day,few doses of 10mg  with elevated readings few months ago -  usually 1/2 pill. No new side effects.  Home readings: BP Readings from Last 3 Encounters:  04/07/24 128/76  09/19/23 136/70  02/12/23 120/82   Lab Results  Component Value Date   CREATININE 1.32 09/19/2023   BPH with nocturia Stable with finasteride  when discussed in May. Treated by urology prior. Few years ago.  Viagra  as needed for erectile dysfunction, half dosing of 100 mg without vision, hearing changes chest pain or dyspnea with exertion.  Medications have remained effective without any side effects. No recent PSA.   Lab Results  Component Value Date   PSA1 0.5 07/10/2018   PSA1 0.5 05/26/2017   PSA 0.43 05/09/2015   PSA 0.50 05/03/2014   PSA 0.48 12/29/2012   HM: Vaccines planned at pharmacy (tdap, flu, covid, shingrix).    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 Authorizing Provider  fluticasone  (FLONASE ) 50 MCG/ACT nasal spray SHAKE LIQUID AND USE 2 SPRAYS IN EACH NOSTRIL DAILY 02/27/22  Yes Levora Reyes SAUNDERS, MD  lisinopril  (ZESTRIL ) 10 MG tablet Take 0.5 tablets (5 mg total) by mouth daily. 09/19/23  Yes Levora Reyes SAUNDERS, MD  meclizine  (ANTIVERT ) 25 MG tablet Take 1 tablet (25 mg total) by mouth 3 (three) times daily as needed for dizziness. 09/19/23  Yes Levora Reyes SAUNDERS, MD  sildenafil  (VIAGRA ) 100 MG tablet TAKE 1/2 TO 1 TABLET BY MOUTH DAILY AS NEEDED FOR ERECTILE DYSFUNCTION 01/26/24  Yes Levora Reyes SAUNDERS, MD  finasteride  (PROSCAR ) 5 MG tablet TAKE 1 TABLET(5 MG) BY MOUTH DAILY 09/19/23   Levora Reyes SAUNDERS, MD   Social History   Socioeconomic History   Marital status: Single    Spouse name: Not on file   Number of children: Not on file   Years of education: Not on file   Highest education level: Bachelor's degree (e.g., BA, AB, BS)  Occupational History   Not on file  Tobacco Use   Smoking status: Never   Smokeless tobacco: Never  Vaping Use   Vaping status: Never Used  Substance and Sexual Activity   Alcohol use: Yes    Comment: rare   Drug use: No   Sexual activity: Yes  Other Topics Concern   Not on file  Social History Narrative   Not  on file   Social Drivers of Health   Financial Resource Strain: Low Risk  (04/07/2024)   Overall Financial Resource Strain (CARDIA)    Difficulty of Paying Living Expenses: Not hard at all  Food Insecurity: No Food Insecurity (04/07/2024)   Hunger Vital Sign    Worried About Running Out of Food in the Last Year: Never true    Ran Out of Food in the Last Year: Never true  Transportation Needs: No Transportation Needs (04/07/2024)   PRAPARE - Administrator, Civil Service (Medical): No    Lack of Transportation (Non-Medical): No  Physical Activity: Sufficiently Active (04/07/2024)   Exercise  Vital Sign    Days of Exercise per Week: 7 days    Minutes of Exercise per Session: 60 min  Stress: No Stress Concern Present (04/07/2024)   [employer] of Occupational Health - Occupational Stress Questionnaire    Feeling of Stress: Not at all  Social Connections: Moderately Integrated (04/07/2024)   Social Connection and Isolation Panel    Frequency of Communication with Friends and Family: More than three times a week    Frequency of Social Gatherings with Friends and Family: More than three times a week    Attends Religious Services: More than 4 times per year    Active Member of [employer] or Organizations: Yes    Attends Banker Meetings: 1 to 4 times per year    Marital Status: Divorced  Intimate Partner Violence: Not At Risk (01/13/2024)   Humiliation, Afraid, Rape, and Kick questionnaire    Fear of Current or Ex-Partner: No    Emotionally Abused: No    Physically Abused: No    Sexually Abused: No    Review of Systems  Constitutional:  Negative for fatigue and unexpected weight change.  Eyes:  Negative for visual disturbance.  Respiratory:  Negative for cough, chest tightness and shortness of breath.   Cardiovascular:  Negative for chest pain, palpitations and leg swelling.  Gastrointestinal:  Negative for abdominal pain and blood in stool.  Neurological:  Negative for dizziness, light-headedness and headaches.     Objective:   Vitals:   04/07/24 1049  BP: 128/76  Pulse: 65  Resp: 16  Temp: 98 F (36.7 C)  TempSrc: Temporal  SpO2: 99%  Weight: 165 lb (74.8 kg)  Height: 5' 9 (1.753 m)     Physical Exam Vitals reviewed.  Constitutional:      Appearance: He is well-developed.  HENT:     Head: Normocephalic and atraumatic.  Neck:     Vascular: No carotid bruit or JVD.  Cardiovascular:     Rate and Rhythm: Normal rate and regular rhythm.     Heart sounds: Normal heart sounds. No murmur heard. Pulmonary:     Effort: Pulmonary effort is  normal.     Breath sounds: Normal breath sounds. No rales.  Musculoskeletal:     Right lower leg: No edema.     Left lower leg: No edema.  Skin:    General: Skin is warm and dry.  Neurological:     Mental Status: He is alert and oriented to person, place, and time.  Psychiatric:        Mood and Affect: Mood normal.        Assessment & Plan:  Gerron Guidotti is a 73 y.o. male . Essential hypertension, benign - Plan: Comprehensive metabolic panel with GFR  -  Stable, tolerating current regimen. Medications prior refilled.  Labs pending  as above.   Benign prostatic hyperplasia with nocturia - Plan: PSA  - stable symptoms with use of finasteride .  Check updated PSA, no med changes at this time.  Erectile dysfunction, unspecified erectile dysfunction type - Plan: sildenafil  (VIAGRA ) 100 MG tablet  - Stable with half dosing of sildenafil  without new side effects.  Lowest effective dose discussed and potential side effects and risks have been discussed.  54-month follow-up  Elevated LDL cholesterol level - Plan: Comprehensive metabolic panel with GFR, Lipid panel  - Check labs and adjust plan accordingly, no new meds for now.  Slight elevation previously.  Meds ordered this encounter  Medications   sildenafil  (VIAGRA ) 100 MG tablet    Sig: TAKE 1/2 TO 1 TABLET BY MOUTH DAILY AS NEEDED FOR ERECTILE DYSFUNCTION    Dispense:  25 tablet    Refill:  1   Patient Instructions  Thank you for coming in today. No change in medications at this time. If there are any concerns on your bloodwork, I will let you know. Take care!     Signed,   Reyes Pines, MD [facility], [facility] 04/07/24 11:38 AM

## 2024-04-07 NOTE — Patient Instructions (Signed)
 Thank you for coming in today. No change in medications at this time. If there are [id number] concerns on your bloodwork, I will let you know. Take care!

## 2024-04-13 ENCOUNTER — Ambulatory Visit: Payer: Self-pay | Admitting: Family Medicine

## 2024-10-06 ENCOUNTER — Ambulatory Visit: Admitting: Family Medicine
